# Patient Record
Sex: Male | Born: 1985 | Race: White | Hispanic: No | Smoking: Never smoker
Health system: Southern US, Community
[De-identification: ages and names within clinical notes are randomized; demographics above are authoritative.]

## PROBLEM LIST (undated history)

## (undated) DIAGNOSIS — F419 Anxiety disorder, unspecified: Secondary | ICD-10-CM

## (undated) HISTORY — DX: Anxiety disorder, unspecified: F41.9

## (undated) HISTORY — PX: TONSILECTOMY, ADENOIDECTOMY, BILATERAL MYRINGOTOMY AND TUBES: SHX2538

---
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
## 2014-04-22 ENCOUNTER — Ambulatory Visit (INDEPENDENT_AMBULATORY_CARE_PROVIDER_SITE_OTHER): Payer: BLUE CROSS/BLUE SHIELD | Admitting: Family Medicine

## 2014-04-22 VITALS — BP 122/74 | HR 71 | Temp 98.0°F | Resp 17 | Ht 72.5 in | Wt 178.0 lb

## 2014-04-22 DIAGNOSIS — M549 Dorsalgia, unspecified: Secondary | ICD-10-CM

## 2014-04-22 DIAGNOSIS — M791 Myalgia, unspecified site: Secondary | ICD-10-CM

## 2014-04-22 LAB — POCT CBC
Granulocyte percent: 59.9 %G (ref 37–80)
HCT, POC: 41.9 % — AB (ref 43.5–53.7)
Hemoglobin: 13.8 g/dL — AB (ref 14.1–18.1)
Lymph, poc: 2.3 (ref 0.6–3.4)
MCH, POC: 30.3 pg (ref 27–31.2)
MCHC: 32.9 g/dL (ref 31.8–35.4)
MCV: 92 fL (ref 80–97)
MID (cbc): 0.3 (ref 0–0.9)
MPV: 7 fL (ref 0–99.8)
POC Granulocyte: 4 (ref 2–6.9)
POC LYMPH PERCENT: 35 %L (ref 10–50)
POC MID %: 5.1 %M (ref 0–12)
Platelet Count, POC: 244 10*3/uL (ref 142–424)
RBC: 4.55 M/uL — AB (ref 4.69–6.13)
RDW, POC: 12.1 %
WBC: 6.7 10*3/uL (ref 4.6–10.2)

## 2014-04-22 LAB — POCT UA - MICROSCOPIC ONLY
Bacteria, U Microscopic: NEGATIVE
Casts, Ur, LPF, POC: NEGATIVE
Crystals, Ur, HPF, POC: NEGATIVE
Epithelial cells, urine per micros: NEGATIVE
Mucus, UA: NEGATIVE
RBC, urine, microscopic: NEGATIVE
WBC, Ur, HPF, POC: NEGATIVE
Yeast, UA: NEGATIVE

## 2014-04-22 LAB — POCT URINALYSIS DIPSTICK
Bilirubin, UA: NEGATIVE
Blood, UA: NEGATIVE
Glucose, UA: NEGATIVE
Ketones, UA: NEGATIVE
Leukocytes, UA: NEGATIVE
Nitrite, UA: NEGATIVE
Protein, UA: NEGATIVE
Spec Grav, UA: 1.01
Urobilinogen, UA: 0.2
pH, UA: 6.5

## 2014-04-22 LAB — POCT SEDIMENTATION RATE: POCT SED RATE: 4 mm/hr (ref 0–22)

## 2014-04-22 MED ORDER — HYDROCODONE-ACETAMINOPHEN 5-325 MG PO TABS: 1.0000 | ORAL_TABLET | Freq: Four times a day (QID) | ORAL | Status: DC | PRN

## 2014-04-22 MED ORDER — CYCLOBENZAPRINE HCL 5 MG PO TABS: ORAL_TABLET | ORAL | Status: DC

## 2014-04-22 NOTE — Patient Instructions (Addendum)
Start over the counter advil or alleve, heat to area, flexeril and if needed - hydrocodone as needed for more severe pain.  If not improving into tomorrow - call me and I can order a CT scan of the spine to determine if other cause of pain. Return to the clinic or go to the nearest emergency room if any of your symptoms worsen or new symptoms occur.

## 2014-04-22 NOTE — Progress Notes (Addendum)
Subjective:  This chart was scribed for Merri Ray, MD, by Tamsen Roers, at [facility].  This patient was seen in room 8 and the patient's care was started at 4:28 PM.    Patient ID: Angel Baxter, male    DOB: 1986/01/29, 29 y.o.   MRN: [id number]  Back Pain Pertinent negatives include no fever.    HPI Comments: Angel Baxter is a 29 y.o. male who presents to the [facility] for radiating back pain that started 2 days ago. Patient states that the pain has gotten worse over the past 8 hours. He states the pain is worse with sitting, lying down, and standing. Notes the pain started in his low/mid back radiates up to his shoulder blades. He went to the ER last night and they did a urinalysis (which came out clear).  PTA (an hour ago), patient was at [facility] but states that they could not find anything on his X-Rays sothey sent him here for blood work.  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  Notes trimoidal did not help him and the Vicodin from the ER helped him sleep but didn't relieve the pain much. Denies fever nausea, vomiting, unexpected weight loss, night sweats, cough, SOB.  Patient took a 3/325 Vicodin this morning.  Patient notes that he did not feel his back pain when he went out in the cold this morning and thought it was going away but the pain came back once he went indoors.  Patient notes he is otherwise healthy.    Discussed with provider from [facility] who saw patient today.  No acute findings on back exam or lumbar X-Ray.      Review of Systems  Constitutional: Negative for fever.  Respiratory: Negative for cough and shortness of breath.   Gastrointestinal: Negative for nausea and vomiting.  Musculoskeletal: Positive for back pain.   There are no active problems to display for this patient.  No past medical history on file. No past  surgical history on file. No Known Allergies Prior to Admission medications   Not on File   History   Social History  . Marital Status: Significant Other    Spouse Name: N/A    Number of Children: N/A  . Years of Education: N/A   Occupational History  . Not on file.   Social History Main Topics  . Smoking status: Never Smoker   . Smokeless tobacco: Not on file  . Alcohol Use: Not on file  . Drug Use: Not on file  . Sexual Activity: Not on file   Other Topics Concern  . Not on file   Social History Narrative  . No narrative on file        Objective:   Physical Exam  Constitutional: He is oriented to person, place, and time. He appears well-developed and well-nourished. No distress.  HENT:  Head: Normocephalic and atraumatic.  Eyes: Conjunctivae and EOM are normal.  Neck: Neck supple. No tracheal deviation present.  Cardiovascular: Normal rate.   Pulmonary/Chest: Effort normal. No respiratory distress.  Abdominal: Soft. There is no tenderness.  Negative Mcburney's point Negative murphys sighn  Flat non distended nontender.    Musculoskeletal: Normal range of motion.  Lumbar spine full range of motion.  No appreciable spasm but notes location of pain deep to paraspinal muscles in mid to lower back.    Neurological:  He is alert and oriented to person, place, and time.  Skin: Skin is warm and dry.  Skin intact no rash and no superficial tenderness.   Psychiatric: He has a normal mood and affect. His behavior is normal.  Nursing note and vitals reviewed.    Filed Vitals:   04/22/14 1616  BP: 122/74  Pulse: 71  Temp: 98 F (36.7 C)  TempSrc: Oral  Resp: 17  Height: 6' 0.5" (1.842 m)  Weight: 178 lb (80.74 kg)  SpO2: 100%   Results for orders placed or performed in visit on 04/22/14  POCT urinalysis dipstick  Result Value Ref Range   Color, UA yellow    Clarity, UA clear    Glucose, UA neg    Bilirubin, UA neg    Ketones, UA neg    Spec Grav, UA  1.010    Blood, UA neg    pH, UA 6.5    Protein, UA neg    Urobilinogen, UA 0.2    Nitrite, UA neg    Leukocytes, UA Negative   POCT UA - Microscopic Only  Result Value Ref Range   WBC, Ur, HPF, POC neg    RBC, urine, microscopic neg    Bacteria, U Microscopic neg    Mucus, UA neg    Epithelial cells, urine per micros neg    Crystals, Ur, HPF, POC neg    Casts, Ur, LPF, POC neg    Yeast, UA neg   POCT CBC  Result Value Ref Range   WBC 6.7 4.6 - 10.2 K/uL   Lymph, poc 2.3 0.6 - 3.4   POC LYMPH PERCENT 35.0 10 - 50 %L   MID (cbc) 0.3 0 - 0.9   POC MID % 5.1 0 - 12 %M   POC Granulocyte 4.0 2 - 6.9   Granulocyte percent 59.9 37 - 80 %G   RBC 4.55 (A) 4.69 - 6.13 M/uL   Hemoglobin 13.8 (A) 14.1 - 18.1 g/dL   HCT, POC 41.9 (A) 43.5 - 53.7 %   MCV 92.0 80 - 97 fL   MCH, POC 30.3 27 - 31.2 pg   MCHC 32.9 31.8 - 35.4 g/dL   RDW, POC 12.1 %   Platelet Count, POC 244 142 - 424 K/uL   MPV 7.0 0 - 99.8 fL        Assessment & Plan:   Angel Baxter is a 29 y.o. male Mild back pain - Plan: POCT urinalysis dipstick, POCT UA - Microscopic Only, POCT CBC, POCT SEDIMENTATION RATE, CK, HYDROcodone-acetaminophen (NORCO/VICODIN) 5-325 MG per tablet, cyclobenzaprine (FLEXERIL) 5 MG tablet  Myalgia - Plan: CK, HYDROcodone-acetaminophen (NORCO/VICODIN) 5-325 MG per tablet, cyclobenzaprine (FLEXERIL) 5 MG tablet  Acute onset mid to lower back pain 2 days ago, with worsening. No chest or GI/GU symptoms.  Reassuring CBC, ESR, and discussed with ortho - LS spine at their office without concerning findings.   -trail of flexeril for possible strain Earney Hamburg and spasm, lortab if needed for increased pain.   -check CK, but less likely Rhabdo without change in activity.   -consider CT in am if not improving - overnight ER precautions discussed.   Meds ordered this encounter  Medications  . HYDROcodone-acetaminophen (NORCO/VICODIN) 5-325 MG per tablet    Sig: Take 1 tablet by mouth every 6 (six)  hours as needed for moderate pain.    Dispense:  15 tablet    Refill:  0  . cyclobenzaprine (FLEXERIL) 5 MG tablet  Sig: 1 pill by mouth up to every 8 hours as needed. Start with one pill by mouth each bedtime as needed due to sedation    Dispense:  15 tablet    Refill:  0   Patient Instructions  Start over the counter advil or alleve, heat to area, flexeril and if needed - hydrocodone as needed for more severe pain.  If not improving into tomorrow - call me and I can order a CT scan of the spine to determine if other cause of pain. Return to the clinic or go to the nearest emergency room if any of your symptoms worsen or new symptoms occur.      I personally performed the services described in this documentation, which was scribed in my presence. The recorded information has been reviewed and considered, and addended by me as needed.

## 2014-04-23 ENCOUNTER — Telehealth: Payer: Self-pay

## 2014-04-23 LAB — CK: Total CK: 199 U/L (ref 7–232)

## 2014-04-23 NOTE — Telephone Encounter (Signed)
Called pt to check status. Pain was worse overnight, but feeling a little better today.  Did need hydrocodone and flexeril last night, but no meds needed since during the night.  No hematuria, some increased frequency, but drinking more fluids.  Reviewed normal ESR, but CK level pending.  If worsening overnight - still consider CT.  Can call with status tomorrow if needed, and RTC precautions.

## 2014-04-23 NOTE — Telephone Encounter (Signed)
Pt is returning Dr. Neva SeatGreene call relating to last night visit   Best number [phone]

## 2014-04-24 ENCOUNTER — Emergency Department (HOSPITAL_COMMUNITY): Payer: BLUE CROSS/BLUE SHIELD

## 2014-04-24 ENCOUNTER — Encounter (HOSPITAL_COMMUNITY): Payer: Self-pay | Admitting: *Deleted

## 2014-04-24 ENCOUNTER — Ambulatory Visit (INDEPENDENT_AMBULATORY_CARE_PROVIDER_SITE_OTHER): Payer: BLUE CROSS/BLUE SHIELD

## 2014-04-24 ENCOUNTER — Ambulatory Visit (INDEPENDENT_AMBULATORY_CARE_PROVIDER_SITE_OTHER): Payer: BLUE CROSS/BLUE SHIELD | Admitting: Family Medicine

## 2014-04-24 ENCOUNTER — Emergency Department (HOSPITAL_COMMUNITY)
Admission: EM | Admit: 2014-04-24 | Discharge: 2014-04-24 | Disposition: A | Discharge status: Home / Self Care | Payer: BLUE CROSS/BLUE SHIELD | Attending: Emergency Medicine | Admitting: Emergency Medicine

## 2014-04-24 VITALS — BP 148/78 | HR 62 | Temp 97.6°F | Resp 16 | Ht 71.0 in | Wt 181.6 lb

## 2014-04-24 DIAGNOSIS — M546 Pain in thoracic spine: Secondary | ICD-10-CM | POA: Insufficient documentation

## 2014-04-24 DIAGNOSIS — R1012 Left upper quadrant pain: Secondary | ICD-10-CM | POA: Diagnosis not present

## 2014-04-24 DIAGNOSIS — R1084 Generalized abdominal pain: Secondary | ICD-10-CM

## 2014-04-24 DIAGNOSIS — R1032 Left lower quadrant pain: Secondary | ICD-10-CM | POA: Diagnosis present

## 2014-04-24 DIAGNOSIS — M549 Dorsalgia, unspecified: Secondary | ICD-10-CM

## 2014-04-24 DIAGNOSIS — R112 Nausea with vomiting, unspecified: Secondary | ICD-10-CM | POA: Diagnosis not present

## 2014-04-24 DIAGNOSIS — M545 Low back pain: Secondary | ICD-10-CM | POA: Insufficient documentation

## 2014-04-24 LAB — CBC WITH DIFFERENTIAL/PLATELET
BASOS ABS: 0 10*3/uL (ref 0.0–0.1)
BASOS PCT: 0 % (ref 0–1)
EOS ABS: 0.1 10*3/uL (ref 0.0–0.7)
EOS PCT: 1 % (ref 0–5)
HCT: 38.4 % — ABNORMAL LOW (ref 39.0–52.0)
Hemoglobin: 13.6 g/dL (ref 13.0–17.0)
LYMPHS ABS: 1.3 10*3/uL (ref 0.7–4.0)
Lymphocytes Relative: 23 % (ref 12–46)
MCH: 30.3 pg (ref 26.0–34.0)
MCHC: 35.4 g/dL (ref 30.0–36.0)
MCV: 85.5 fL (ref 78.0–100.0)
MONO ABS: 0.8 10*3/uL (ref 0.1–1.0)
Monocytes Relative: 13 % — ABNORMAL HIGH (ref 3–12)
Neutro Abs: 3.7 10*3/uL (ref 1.7–7.7)
Neutrophils Relative %: 63 % (ref 43–77)
PLATELETS: 217 10*3/uL (ref 150–400)
RBC: 4.49 MIL/uL (ref 4.22–5.81)
RDW: 11.5 % (ref 11.5–15.5)
WBC: 5.9 10*3/uL (ref 4.0–10.5)

## 2014-04-24 LAB — LIPASE, BLOOD: LIPASE: 32 U/L (ref 11–59)

## 2014-04-24 LAB — COMPREHENSIVE METABOLIC PANEL
ALT: 15 U/L (ref 0–53)
AST: 20 U/L (ref 0–37)
Albumin: 4.6 g/dL (ref 3.5–5.2)
Alkaline Phosphatase: 41 U/L (ref 39–117)
Anion gap: 9 (ref 5–15)
BUN: 8 mg/dL (ref 6–23)
CALCIUM: 9.4 mg/dL (ref 8.4–10.5)
CO2: 25 mmol/L (ref 19–32)
Chloride: 98 mEq/L (ref 96–112)
Creatinine, Ser: 0.95 mg/dL (ref 0.50–1.35)
GFR calc non Af Amer: >90 mL/min (ref >90)
Glucose, Bld: 107 mg/dL — ABNORMAL HIGH (ref 70–99)
Potassium: 3.5 mmol/L (ref 3.5–5.1)
Sodium: 132 mmol/L — ABNORMAL LOW (ref 135–145)
Total Bilirubin: 0.9 mg/dL (ref 0.3–1.2)
Total Protein: 7.3 g/dL (ref 6.0–8.3)

## 2014-04-24 LAB — POCT UA - MICROSCOPIC ONLY
Bacteria, U Microscopic: NEGATIVE
Casts, Ur, LPF, POC: NEGATIVE
Crystals, Ur, HPF, POC: NEGATIVE
Epithelial cells, urine per micros: NEGATIVE
Mucus, UA: NEGATIVE
RBC, URINE, MICROSCOPIC: NEGATIVE
WBC, UR, HPF, POC: NEGATIVE
Yeast, UA: NEGATIVE

## 2014-04-24 LAB — URINALYSIS, ROUTINE W REFLEX MICROSCOPIC
BILIRUBIN URINE: NEGATIVE
Glucose, UA: NEGATIVE mg/dL
HGB URINE DIPSTICK: NEGATIVE
Ketones, ur: 40 mg/dL — AB
Leukocytes, UA: NEGATIVE
Nitrite: NEGATIVE
Protein, ur: NEGATIVE mg/dL
Specific Gravity, Urine: 1.013 (ref 1.005–1.030)
UROBILINOGEN UA: 1 mg/dL (ref 0.0–1.0)
pH: 7.5 (ref 5.0–8.0)

## 2014-04-24 LAB — POCT CBC
Granulocyte percent: 65.8 %G (ref 37–80)
HCT, POC: 42.3 % — AB (ref 43.5–53.7)
Hemoglobin: 13.9 g/dL — AB (ref 14.1–18.1)
Lymph, poc: 1.7 (ref 0.6–3.4)
MCH, POC: 30.8 pg (ref 27–31.2)
MCHC: 32.8 g/dL (ref 31.8–35.4)
MCV: 93.9 fL (ref 80–97)
MID (cbc): 0.5 (ref 0–0.9)
MPV: 6.9 fL (ref 0–99.8)
POC Granulocyte: 4.2 (ref 2–6.9)
POC LYMPH PERCENT: 26.3 %L (ref 10–50)
POC MID %: 7.9 %M (ref 0–12)
Platelet Count, POC: 247 10*3/uL (ref 142–424)
RBC: 4.5 M/uL — AB (ref 4.69–6.13)
RDW, POC: 12.4 %
WBC: 6.4 10*3/uL (ref 4.6–10.2)

## 2014-04-24 LAB — POCT URINALYSIS DIPSTICK
BILIRUBIN UA: NEGATIVE
Blood, UA: NEGATIVE
Glucose, UA: NEGATIVE
LEUKOCYTES UA: NEGATIVE
Nitrite, UA: NEGATIVE
PH UA: 7.5
Protein, UA: NEGATIVE
Spec Grav, UA: 1.01
UROBILINOGEN UA: 0.2

## 2014-04-24 LAB — GLUCOSE, POCT (MANUAL RESULT ENTRY): POC Glucose: 106 mg/dl — AB (ref 70–99)

## 2014-04-24 MED ORDER — ONDANSETRON HCL 4 MG PO TABS: 4.0000 mg | ORAL_TABLET | Freq: Four times a day (QID) | ORAL | Status: DC

## 2014-04-24 MED ORDER — MORPHINE SULFATE 4 MG/ML IJ SOLN
4.0000 mg | Freq: Once | INTRAMUSCULAR | Status: AC
  Administered 2014-04-24: 4 mg via INTRAVENOUS
  Filled 2014-04-24: qty 1

## 2014-04-24 MED ORDER — IOHEXOL 300 MG/ML  SOLN
25.0000 mL | Freq: Once | INTRAMUSCULAR | Status: AC | PRN
  Administered 2014-04-24: 25 mL via ORAL

## 2014-04-24 MED ORDER — IOHEXOL 300 MG/ML  SOLN
100.0000 mL | Freq: Once | INTRAMUSCULAR | Status: AC | PRN
  Administered 2014-04-24: 100 mL via INTRAVENOUS

## 2014-04-24 MED ORDER — ONDANSETRON 4 MG PO TBDP
4.0000 mg | ORAL_TABLET | Freq: Once | ORAL | Status: AC
  Administered 2014-04-24: 4 mg via ORAL

## 2014-04-24 MED ORDER — OXYCODONE-ACETAMINOPHEN 5-325 MG PO TABS: 2.0000 | ORAL_TABLET | ORAL | Status: DC | PRN

## 2014-04-24 NOTE — Discharge Instructions (Signed)
Abdominal Pain Many things can cause abdominal pain. Usually, abdominal pain is not caused by a disease and will improve without treatment. It can often be observed and treated at home. Your health care provider will do a physical exam and possibly order blood tests and X-rays to help determine the seriousness of your pain. However, in many cases, more time must pass before a clear cause of the pain can be found. Before that point, your health care provider may not know if you need more testing or further treatment. HOME CARE INSTRUCTIONS  Monitor your abdominal pain for any changes. The following actions may help to alleviate any discomfort you are experiencing:  Only take over-the-counter or prescription medicines as directed by your health care provider.  Do not take laxatives unless directed to do so by your health care provider.  Try a clear liquid diet (broth, tea, or water) as directed by your health care provider. Slowly move to a bland diet as tolerated. SEEK MEDICAL CARE IF:  You have unexplained abdominal pain.  You have abdominal pain associated with nausea or diarrhea.  You have pain when you urinate or have a bowel movement.  You experience abdominal pain that wakes you in the night.  You have abdominal pain that is worsened or improved by eating food.  You have abdominal pain that is worsened with eating fatty foods.  You have a fever. SEEK IMMEDIATE MEDICAL CARE IF:   Your pain does not go away within 2 hours.  You keep throwing up (vomiting).  Your pain is felt only in portions of the abdomen, such as the right side or the left lower portion of the abdomen.  You pass bloody or black tarry stools. MAKE SURE YOU:  Understand these instructions.   Will watch your condition.   Will get help right away if you are not doing well or get worse.  Document Released: 01/02/2005 Document Revised: 03/30/2013 Document Reviewed: 12/02/2012 [facility] Patient Information  2015 [employer] [place]. This information is not intended to replace advice given to you by your health care provider. Make sure you discuss any questions you have with your health care provider.   You were evaluated in the ED today for your back and abdominal pain. There does not appear to be an emergent source for your symptoms at this time. Your CT scan did not reveal any acute causes for your pain. It is important for you to follow-up with orthopedics for further evaluation and management of your symptoms. Please take your pain medicines as prescribed. He may use the nausea medicine as needed. Return to ED for worsening symptoms, fevers, numbness or weakness, loss of bowel or bladder function

## 2014-04-24 NOTE — Progress Notes (Addendum)
Subjective:    Patient ID: [name], male    DOB: 01/30/1986, 29 y.o.   MRN: [id number] This chart was scribed for Meredith Staggers, MD by Jolene Provost, Medical Scribe. This patient was seen in Room 1 and the patient's care was started a 10:35 AM.  Chief Complaint  Patient presents with  . Follow-up  . Back Pain    HPI HPI Comments: STOY FENN is a 29 y.o. male who presents to [facility] for a follow up complaining of continuing back pain, worse with sitting or laying down, better with standing, and states that his back pain has now moved from the small of his back up into his shoulder blades and neck. Pt denies fever, chills, photophobia, phonophobia or HA.  Pt also states he has developed abdominal pain on lower left side with associated vomiting that started yesterday evening. Pt states he vomited twice last night. Pt has not vomited this morning. Pt states he thinks the Vicodin may have caused his vomiting and abdominal pain. Pt endorses increased urination, but states he has been drinking increased fluids. Pt denies dysuria, hematuria, rash on abdomen, SOB, CP or swelling in calfs. Pt denies hx of blood clots. Pt denies hx of similar sx. Pt states he did not take any hydrocodone yesterday during the day.  Pt was last seen two days ago for back pain after an evaluation in the ED, as well as orthopedics for his sx. Pt reported normal x-rays in orthopedics. He was afebrile with benign abdominal exam. Pt had no cardiac or pulmonary sx. Vitals normal. SED rate normal. CK level normal. Repeat urinalysis at [facility] was also negative. Pt had no known injury or change in activities. Was treated with flexeril and hydrocodone.   There are no active problems to display for this patient.  No past medical history on file. No past surgical history on file. No Known Allergies Prior to Admission medications   Medication Sig Start Date End Date Taking? Authorizing Provider  cyclobenzaprine  (FLEXERIL) 5 MG tablet 1 pill by mouth up to every 8 hours as needed. Start with one pill by mouth each bedtime as needed due to sedation 04/22/14  Yes Shade Flood, MD  HYDROcodone-acetaminophen (NORCO/VICODIN) 5-325 MG per tablet Take 1 tablet by mouth every 6 (six) hours as needed for moderate pain. 04/22/14  Yes Shade Flood, MD   History   Social History  . Marital Status: Significant Other    Spouse Name: N/A    Number of Children: N/A  . Years of Education: N/A   Occupational History  . Not on file.   Social History Main Topics  . Smoking status: Never Smoker   . Smokeless tobacco: Not on file  . Alcohol Use: Not on file  . Drug Use: Not on file  . Sexual Activity: Not on file   Other Topics Concern  . Not on file   Social History Narrative    Review of Systems  Constitutional: Negative for fever and chills.  HENT:       Denies phonophobia  Eyes: Negative for photophobia.  Cardiovascular: Negative for chest pain and leg swelling.  Gastrointestinal: Positive for vomiting and abdominal pain.  Genitourinary: Negative for dysuria and hematuria.  Musculoskeletal: Positive for back pain.  Neurological: Negative for weakness and headaches.       Objective:   Physical Exam  Constitutional: He is oriented to person, place, and time. He appears well-developed and well-nourished.  HENT:  Head: Normocephalic and atraumatic.  Nose: Nose normal.  Mouth/Throat: Oropharynx is clear and moist.  No rash in mouth.  Eyes: Pupils are equal, round, and reactive to light.  Neck: Neck supple.  Negative brudzinski, with pain only in lower back, no neck pain.  Cardiovascular: Normal rate and regular rhythm.   Pulmonary/Chest: Effort normal and breath sounds normal. No respiratory distress.  Abdominal:  Abdomen flat. Normal bowel sounds. Mildly TTP LUQ, no rebound or gaurding. Minimal TTP LLQ. Negative murphy's sign. Negative mcburnies point. Negative heel jar.    Musculoskeletal:  No focal tenderness in the lumbar or thoracic spine. No appreciable spasm. Lumbar spine full ROM, but guarded with ROM.   Lymphadenopathy:    He has no cervical adenopathy.  Neurological: He is alert and oriented to person, place, and time.  Skin: Skin is warm and dry. No rash noted.  No rash.   Psychiatric: He has a normal mood and affect. His behavior is normal.  Nursing note and vitals reviewed.   Filed Vitals:   04/24/14 0946  BP: 148/78  Pulse: 62  Temp: 97.6 F (36.4 C)  TempSrc: Oral  Resp: 16  Height:  (1.803 m)  Weight: 181 lb 9.6 oz (82.373 kg)  SpO2: 100%   Results for orders placed or performed in visit on 04/24/14  POCT CBC  Result Value Ref Range   WBC 6.4 4.6 - 10.2 K/uL   Lymph, poc 1.7 0.6 - 3.4   POC LYMPH PERCENT 26.3 10 - 50 %L   MID (cbc) 0.5 0 - 0.9   POC MID % 7.9 0 - 12 %M   POC Granulocyte 4.2 2 - 6.9   Granulocyte percent 65.8 37 - 80 %G   RBC 4.50 (A) 4.69 - 6.13 M/uL   Hemoglobin 13.9 (A) 14.1 - 18.1 g/dL   HCT, POC 16.1 (A) 09.6 - 53.7 %   MCV 93.9 80 - 97 fL   MCH, POC 30.8 27 - 31.2 pg   MCHC 32.8 31.8 - 35.4 g/dL   RDW, POC 04.5 %   Platelet Count, POC 247 142 - 424 K/uL   MPV 6.9 0 - 99.8 fL  POCT glucose (manual entry)  Result Value Ref Range   POC Glucose 106 (A) 70 - 99 mg/dl  POCT urinalysis dipstick  Result Value Ref Range   Color, UA light yellow    Clarity, UA clear    Glucose, UA neg    Bilirubin, UA neg    Ketones, UA trace    Spec Grav, UA 1.010    Blood, UA neg    pH, UA 7.5    Protein, UA neg    Urobilinogen, UA 0.2    Nitrite, UA neg    Leukocytes, UA Negative   POCT UA - Microscopic Only  Result Value Ref Range   WBC, Ur, HPF, POC neg    RBC, urine, microscopic neg    Bacteria, U Microscopic neg    Mucus, UA neg    Epithelial cells, urine per micros neg    Crystals, Ur, HPF, POC neg    Casts, Ur, LPF, POC neg    Yeast, UA neg   [facility] reading (PRIMARY) by  Dr. Neva Seat: CXR: no  acute findings.     Assessment & Plan:   THAD OSORIA is a 29 y.o. male LUQ abdominal pain - Plan: DG Chest 2 View, POCT urinalysis dipstick, POCT UA - Microscopic Only  Upper back pain - Plan:  POCT CBC, DG Chest 2 View  Nausea and vomiting, vomiting of unspecified type - Plan: POCT CBC, POCT glucose (manual entry), POCT urinalysis dipstick, POCT UA - Microscopic Only, ondansetron (ZOFRAN-ODT) disintegrating tablet 4 mg   Acute onset of low to mid back pain 3 days ago without known injury. Progressively more painful since that time except some improvement during day yesterday. Now with L upper abdominal pain, N/V overnight. initial ER eval 3 days ago with reported negative U/A, ortho eval 2 days ago with reported negative XRays. Repeat U/a still negative, CBC reassuring and afebrile.  Sed rate 2 days ago normal less likely infectious, and CK level normal to r/o rhabdo. With worsening pain, now with abd pain, and N/V, will have evaluated in ER for possible CT abd/pelvis or other testing TBD by their eval.  [facility] advised.   Meds ordered this encounter  Medications  . ondansetron (ZOFRAN-ODT) disintegrating tablet 4 mg    Sig:    Patient Instructions  [facility] as well as the triage nurse. Go there for further eval after leaving here.  Nothing to eat for now in case anything is found on cat scan that could require surgery.  If needed- follow up with me after emergency room in next few days.

## 2014-04-24 NOTE — ED Provider Notes (Signed)
CSN: [id number]     Arrival date & time 04/24/14  1206 History   First MD Initiated Contact with Patient 04/24/14 1243     Chief Complaint  Patient presents with  . Back Pain  . Abdominal Pain     (Consider location/radiation/quality/duration/timing/severity/associated sxs/prior Treatment) HPI Angel Baxter is a 29 y.o. male , essentially healthy who comes in for evaluation of back pain and abdominal pain. Patient states 4 days ago he was sitting at home when he all of a sudden experienced a sharp stabbing pain in his lower back that has since spread to his entire spine. Sitting and lying flat exacerbates this pain, while standing and improves the pain. He has taken Vicodin with some relief. He rates the pain as 8/10. He also reports an associated abdominal pain and is right and left lower quadrants. He characterizes this as a dull ache and 6/10. He reports one episode of emesis this morning, is unsure if stomach contents because he was outside in the dark. Last bowel movement 3 days ago. Reports he is normally regular every day. Denies fevers headache, changes in vision, chest pain, shortness of breath, loss of bowel or bladder function, numbness or weakness, chronic steroid use, personal history of cancer, IV drug use. Seen in urgent care facility twice, referred here for CT scan of abdomen.  History reviewed. No pertinent past medical history. History reviewed. No pertinent past surgical history. History reviewed. No pertinent family history. History  Substance Use Topics  . Smoking status: Never Smoker   . Smokeless tobacco: Not on file  . Alcohol Use: Not on file    Review of Systems  All other systems reviewed and are negative. A 10 point review of systems was completed and was negative except for pertinent positives and negatives as mentioned in the history of present illness      Allergies  Review of patient's allergies indicates no known allergies.  Home Medications    Prior to Admission medications   Medication Sig Start Date End Date Taking? Authorizing Provider  HYDROcodone-acetaminophen (NORCO/VICODIN) 5-325 MG per tablet Take 1 tablet by mouth every 6 (six) hours as needed for moderate pain. 04/22/14  Yes Shade Flood, MD  cyclobenzaprine (FLEXERIL) 5 MG tablet 1 pill by mouth up to every 8 hours as needed. Start with one pill by mouth each bedtime as needed due to sedation Patient not taking: Reported on 04/24/2014 04/22/14   Shade Flood, MD  ondansetron (ZOFRAN) 4 MG tablet Take 1 tablet (4 mg total) by mouth every 6 (six) hours. 04/24/14   Sharlene Motts, PA-C  oxyCODONE-acetaminophen (PERCOCET/ROXICET) 5-325 MG per tablet Take 2 tablets by mouth every 4 (four) hours as needed for moderate pain or severe pain. 04/24/14   Earle Gell Yarithza Mink, PA-C   BP 137/87 mmHg  Pulse 65  Temp(Src) 98.8 F (37.1 C) (Oral)  Resp 18  Ht 6' (1.829 m)  Wt 185 lb (83.915 kg)  BMI 25.08 kg/m2  SpO2 100% Physical Exam  Constitutional: He is oriented to person, place, and time. He appears well-developed and well-nourished.  HENT:  Head: Normocephalic and atraumatic.  Mouth/Throat: Oropharynx is clear and moist.  Eyes: Conjunctivae are normal. Pupils are equal, round, and reactive to light. Right eye exhibits no discharge. Left eye exhibits no discharge. No scleral icterus.  Neck: Normal range of motion. Neck supple.  Cardiovascular: Normal rate, regular rhythm and normal heart sounds.   Pulmonary/Chest: Effort normal and breath sounds normal.  No respiratory distress. He has no wheezes. He has no rales.  Abdominal: Soft.  Diffuse tenderness without guarding to right and left lower quadrants. Abdomen is otherwise soft, nondistended without obvious lesions or deformities.  Musculoskeletal: He exhibits no tenderness.  diffuse tenderness throughout the paraspinal muscles in thoracic and lumbar region. No midline bony tenderness. No crepitus or step-offs. No  other obvious lesions or deformities. Range of motion intact with discomfort to thoracic and lumbar region  Neurological: He is alert and oriented to person, place, and time.  Cranial Nerves II-XII grossly intact. Motor and sensation 5/5 in all 4 extremities. Extraocular movements intact. Completes finger to nose hand movements without difficulty. Gait baseline  Skin: Skin is warm and dry. No rash noted.  Psychiatric: He has a normal mood and affect.  Nursing note and vitals reviewed.   ED Course  Procedures (including critical care time) Labs Review Labs Reviewed  URINALYSIS, ROUTINE W REFLEX MICROSCOPIC - Abnormal; Notable for the following:    Ketones, ur 40 (*)    All other components within normal limits  CBC WITH DIFFERENTIAL - Abnormal; Notable for the following:    HCT 38.4 (*)    Monocytes Relative 13 (*)    All other components within normal limits  COMPREHENSIVE METABOLIC PANEL - Abnormal; Notable for the following:    Sodium 132 (*)    Glucose, Bld 107 (*)    All other components within normal limits  LIPASE, BLOOD    Imaging Review Dg Chest 2 View  04/24/2014   CLINICAL DATA:  Initial encounter for shortness of breath and left upper quadrant abdominal pain.  EXAM: CHEST  2 VIEW  COMPARISON:  None.  FINDINGS: Patient rotated minimally left. Midline trachea. Normal heart size and mediastinal contours. No pleural effusion or pneumothorax. Clear lungs. No free intraperitoneal air.  IMPRESSION: No acute cardiopulmonary disease.   Electronically Signed   By: Jeronimo GreavesKyle  Talbot M.D.   On: 04/24/2014 12:20   Ct Abdomen Pelvis W Contrast  04/24/2014   CLINICAL DATA:  Bilateral flank pain, left upper quadrant pain, bilateral lower quadrant pain. Nausea, vomiting. Symptoms for 4 days.  EXAM: CT ABDOMEN AND PELVIS WITH CONTRAST  TECHNIQUE: Multidetector CT imaging of the abdomen and pelvis was performed using the standard protocol following bolus administration of intravenous contrast.   CONTRAST:  100mL OMNIPAQUE IOHEXOL 300 MG/ML  SOLN  COMPARISON:  None.  FINDINGS: Lung bases are clear.  No effusions.  Heart is normal size.  Liver, gallbladder, spleen, pancreas, adrenals and kidneys are normal. Appendix is visualized and is normal. Large stool burden throughout the colon. Stomach and small bowel are decompressed.  No renal or ureteral stones. No hydronephrosis. Urinary bladder is unremarkable as is the prostate.  No free fluid, free air or adenopathy.  Aorta is normal caliber.  No acute bony abnormality or focal bone lesion.  IMPRESSION: Large stool burden throughout the colon.  Normal appendix.  No acute findings.   Electronically Signed   By: Charlett NoseKevin  Dover M.D.   On: 04/24/2014 15:59     EKG Interpretation None     Meds given in ED:  Medications  morphine 4 MG/ML injection 4 mg (4 mg Intravenous Given 04/24/14 1352)  iohexol (OMNIPAQUE) 300 MG/ML solution 25 mL (25 mLs Oral Contrast Given 04/24/14 1511)  iohexol (OMNIPAQUE) 300 MG/ML solution 100 mL (100 mLs Intravenous Contrast Given 04/24/14 1543)    Discharge Medication List as of 04/24/2014  4:12 PM    START  taking these medications   Details  ondansetron (ZOFRAN) 4 MG tablet Take 1 tablet (4 mg total) by mouth every 6 (six) hours., Starting 04/24/2014, Until Discontinued, Print    oxyCODONE-acetaminophen (PERCOCET/ROXICET) 5-325 MG per tablet Take 2 tablets by mouth every 4 (four) hours as needed for moderate pain or severe pain., Starting 04/24/2014, Until Discontinued, Print       Filed Vitals:   04/24/14 1315 04/24/14 1415 04/24/14 1558 04/24/14 1616  BP: 153/89 130/76 145/85 137/87  Pulse: 53 48 56 65  Temp:      TempSrc:      Resp:   14 18  Height:      Weight:      SpO2: 100% 99% 100% 100%    MDM  Vitals stable - WNL -afebrile Pt resting comfortably in ED. Pain improved after analgesia administered in ED. PE--diffuse tenderness throughout the paraspinal muscles in thoracic and lumbar region. Normal  neurological exam. Labwork--noncontributory, white count 5.9. Imaging--CT abdomen shows no acute intra-abdominal pathology. No evidence of stones, appendix is normal. There is a large stool burden.  Will discharge with pain medicines, Zofran and referral to orthopedics for further evaluation and management of back pain. No apparent emergent or acute causes for abdominal discomfort.  I discussed all relevant lab findings and imaging results with pt and they verbalized understanding. Discussed f/u with PCP within 48 hrs and return precautions, pt very amenable to plan. Pt stale, in good condition and ambulates out of ED without difficulty.  Prior to patient discharge, I discussed and reviewed this case with Dr. Madilyn Hook Final diagnoses:  Bilateral back pain, unspecified location  Generalized abdominal pain        Sharlene Motts, PA-C 04/24/14 1836  Tilden Fossa, MD 04/27/14 [phone]

## 2014-04-24 NOTE — ED Notes (Signed)
Pt was seen at [facility] recently for back pain and treated with pain meds and flexeril. Today went back to dr office due to lower abd pain and LUQ pain, n/v.

## 2014-04-24 NOTE — Patient Instructions (Signed)
I [facility], as well as the triage nurse. Go there for further eval after leaving here.  Nothing to eat for now in case anything is found on cat scan that could require surgery.  If needed- follow up with me after emergency room in next few days.

## 2016-03-28 ENCOUNTER — Telehealth: Payer: Self-pay | Admitting: Family Medicine

## 2016-03-28 NOTE — Telephone Encounter (Signed)
Pt calling for a copy of his immunizations please call when ready

## 2017-01-28 DIAGNOSIS — Z23 Encounter for immunization: Secondary | ICD-10-CM | POA: Diagnosis not present

## 2017-03-11 DIAGNOSIS — R69 Illness, unspecified: Secondary | ICD-10-CM | POA: Diagnosis not present

## 2017-03-24 DIAGNOSIS — R69 Illness, unspecified: Secondary | ICD-10-CM | POA: Diagnosis not present

## 2017-04-14 DIAGNOSIS — R69 Illness, unspecified: Secondary | ICD-10-CM | POA: Diagnosis not present

## 2017-06-19 ENCOUNTER — Ambulatory Visit: Payer: BLUE CROSS/BLUE SHIELD | Admitting: Family Medicine

## 2018-01-28 DIAGNOSIS — Z23 Encounter for immunization: Secondary | ICD-10-CM | POA: Diagnosis not present

## 2018-08-25 DIAGNOSIS — Z03818 Encounter for observation for suspected exposure to other biological agents ruled out: Secondary | ICD-10-CM | POA: Diagnosis not present

## 2018-10-30 DIAGNOSIS — R3915 Urgency of urination: Secondary | ICD-10-CM | POA: Diagnosis not present

## 2018-10-31 ENCOUNTER — Other Ambulatory Visit: Payer: Self-pay

## 2018-10-31 ENCOUNTER — Encounter (HOSPITAL_COMMUNITY): Payer: Self-pay

## 2018-10-31 ENCOUNTER — Ambulatory Visit (HOSPITAL_COMMUNITY)
Admission: EM | Admit: 2018-10-31 | Discharge: 2018-10-31 | Disposition: A | Discharge status: Home / Self Care | Payer: 59 | Attending: Emergency Medicine | Admitting: Emergency Medicine

## 2018-10-31 DIAGNOSIS — R350 Frequency of micturition: Secondary | ICD-10-CM | POA: Diagnosis not present

## 2018-10-31 LAB — POCT URINALYSIS DIP (DEVICE)
Bilirubin Urine: NEGATIVE
Glucose, UA: NEGATIVE mg/dL
Hgb urine dipstick: NEGATIVE
Ketones, ur: NEGATIVE mg/dL
Leukocytes,Ua: NEGATIVE
Nitrite: NEGATIVE
Protein, ur: NEGATIVE mg/dL
Specific Gravity, Urine: 1.01 (ref 1.005–1.030)
Urobilinogen, UA: 0.2 mg/dL (ref 0.0–1.0)
pH: 7 (ref 5.0–8.0)

## 2018-10-31 NOTE — ED Provider Notes (Signed)
[facility]    CSN: [id number] Arrival date & time: 10/31/18  1119      History   Chief Complaint Chief Complaint  Patient presents with  . Urinary Tract Infection    HPI Angel Baxter is a 33 y.o. male.   Angel Baxter presents with complaints of urinary symptoms which he felt like started approximately 5 days ago. Did a virtual visit and was given macrobid which he has taken for two days, symptoms have not improved. He feels he has some urinary frequency and urgency, sensation of still needing to urinate after he voids. Minimal pain with urination. Some back pain. No pelvic pain. No fevers. No sores, lesions, redness or swelling to genitals or scrotum. Denies  Any previous similar. Sexually active with his wife. His urine has been pale yellow. No gi complaints. He endorses significant caffeine intake due to work as well as with his pre-workout drinks. Without contributing medical history.      ROS per HPI, negative if not otherwise mentioned.      History reviewed. No pertinent past medical history.  There are no active problems to display for this patient.   History reviewed. No pertinent surgical history.     Home Medications    Prior to Admission medications   Medication Sig Start Date End Date Taking? Authorizing Provider  cyclobenzaprine (FLEXERIL) 5 MG tablet 1 pill by mouth up to every 8 hours as needed. Start with one pill by mouth each bedtime as needed due to sedation Patient not taking: Reported on 04/24/2014 04/22/14   Shade FloodGreene, Jeffrey R, MD  HYDROcodone-acetaminophen (NORCO/VICODIN) 5-325 MG per tablet Take 1 tablet by mouth every 6 (six) hours as needed for moderate pain. 04/22/14   Shade FloodGreene, Jeffrey R, MD  ondansetron (ZOFRAN) 4 MG tablet Take 1 tablet (4 mg total) by mouth every 6 (six) hours. 04/24/14   Cartner, Sharlet SalinaBenjamin, PA-C  oxyCODONE-acetaminophen (PERCOCET/ROXICET) 5-325 MG per tablet Take 2 tablets by mouth every 4 (four) hours as needed  for moderate pain or severe pain. 04/24/14   Joycie Peekartner, Benjamin, PA-C    Family History No family history on file.  Social History Social History   Tobacco Use  . Smoking status: Never Smoker  . Smokeless tobacco: Never Used  Substance Use Topics  . Alcohol use: Never    Alcohol/week: 0.0 standard drinks    Frequency: Never  . Drug use: Never     Allergies   Patient has no known allergies.   Review of Systems Review of Systems   Physical Exam Triage Vital Signs ED Triage Vitals  Enc Vitals Group     BP 10/31/18 1224 133/79     Pulse Rate 10/31/18 1224 64     Resp 10/31/18 1224 18     Temp 10/31/18 1224 98.6 F (37 C)     Temp Source 10/31/18 1224 Oral     SpO2 10/31/18 1224 97 %     Weight 10/31/18 1222 215 lb (97.5 kg)     Height --      Head Circumference --      Peak Flow --      Pain Score 10/31/18 1222 2     Pain Loc --      Pain Edu? --      Excl. in GC? --    No data found.  Updated Vital Signs BP 133/79 (BP Location: Right Arm)   Pulse 64   Temp 98.6 F (37 C) (Oral)  Resp 18   Wt 215 lb (97.5 kg)   SpO2 97%   BMI 29.16 kg/m    Physical Exam Constitutional:      Appearance: He is well-developed.  Cardiovascular:     Rate and Rhythm: Normal rate.  Pulmonary:     Effort: Pulmonary effort is normal.  Abdominal:     Tenderness: There is no abdominal tenderness.     Comments: Very mild left back pain on palpation; no abdominal or pelvic pain on palpation.   Skin:    General: Skin is warm and dry.  Neurological:     Mental Status: He is alert and oriented to person, place, and time.      UC Treatments / Results  Labs (all labs ordered are listed, but only abnormal results are displayed) Labs Reviewed  URINE CULTURE  POCT URINALYSIS DIP (DEVICE)  URINE CYTOLOGY ANCILLARY ONLY    EKG   Radiology No results found.  Procedures Procedures (including critical care time)  Medications Ordered in UC Medications - No data to  display  Initial Impression / Assessment and Plan / UC Course  I have reviewed the triage vital signs and the nursing notes.  Pertinent labs & imaging results that were available during my care of the patient were reviewed by me and considered in my medical decision making (see chart for details).     Healthy 33 yo male with urinary symptoms. macrobid not necessarily helpful with symptoms. Afebrile. Urine today is completely normal. Will obtain culture to confirm this. Urine cytology collected as well to ensure negative. Question if caffeine intake is contributing to symptoms? Will notify of any positive findings and if any changes to treatment are needed.  If symptoms worsen or do not improve in the next week to return to be seen or to follow up with PCP.  Patient verbalized understanding and agreeable to plan.   Final Clinical Impressions(s) / UC Diagnoses   Final diagnoses:  Urinary frequency     Discharge Instructions     Your urine sample is completely normal here today which is reassuring.  I don't see any indication of UTI. I will add a culture to this sample to see if anything potentially grows to confirm our finding here in clinic.  It would be uncommon for an otherwise healthy male to have a UTI.  Caffeine can certainly cause some of the symptoms you are experiencing so try to limit this as able.  Drink plenty of water.  Will notify you of any positive findings and if any changes to treatment are needed.   If your symptoms persist or do not improve in the next week to follow up with your primary care provider.    ED Prescriptions    None     Controlled Substance Prescriptions [place] Controlled Substance Registry consulted? Not Applicable   Zigmund Gottron, NP 10/31/18 1300

## 2018-10-31 NOTE — ED Triage Notes (Signed)
Pt states he has a UTI x 1 week.  Pt states he has been taking Microbid and its not working.

## 2018-10-31 NOTE — Discharge Instructions (Addendum)
Your urine sample is completely normal here today which is reassuring.  I don't see any indication of UTI. I will add a culture to this sample to see if anything potentially grows to confirm our finding here in clinic.  It would be uncommon for an otherwise healthy male to have a UTI.  Caffeine can certainly cause some of the symptoms you are experiencing so try to limit this as able.  Drink plenty of water.  Will notify you of any positive findings and if any changes to treatment are needed.   If your symptoms persist or do not improve in the next week to follow up with your primary care provider.

## 2018-11-01 LAB — URINE CULTURE: Culture: NO GROWTH

## 2018-11-03 LAB — URINE CYTOLOGY ANCILLARY ONLY
Chlamydia: NEGATIVE
Neisseria Gonorrhea: NEGATIVE
Trichomonas: NEGATIVE

## 2018-11-10 DIAGNOSIS — Z139 Encounter for screening, unspecified: Secondary | ICD-10-CM | POA: Diagnosis not present

## 2018-12-01 DIAGNOSIS — L910 Hypertrophic scar: Secondary | ICD-10-CM | POA: Diagnosis not present

## 2018-12-01 DIAGNOSIS — D485 Neoplasm of uncertain behavior of skin: Secondary | ICD-10-CM | POA: Diagnosis not present

## 2018-12-28 DIAGNOSIS — Z03818 Encounter for observation for suspected exposure to other biological agents ruled out: Secondary | ICD-10-CM | POA: Diagnosis not present

## 2019-01-13 ENCOUNTER — Ambulatory Visit: Payer: 59 | Admitting: Family Medicine

## 2019-01-13 ENCOUNTER — Other Ambulatory Visit: Payer: Self-pay

## 2019-01-13 ENCOUNTER — Encounter: Payer: Self-pay | Admitting: Family Medicine

## 2019-01-13 VITALS — BP 120/78 | HR 58 | Temp 98.7°F | Wt 207.2 lb

## 2019-01-13 DIAGNOSIS — I493 Ventricular premature depolarization: Secondary | ICD-10-CM | POA: Diagnosis not present

## 2019-01-13 DIAGNOSIS — R002 Palpitations: Secondary | ICD-10-CM | POA: Diagnosis not present

## 2019-01-13 NOTE — Progress Notes (Signed)
Subjective:    Patient ID: Angel Baxter, male    DOB: [birth year], 33 y.o.   MRN: [id number]  HPI Angel Baxter is a 33 y.o. male Presents today for: Chief Complaint  Patient presents with  . Establish Care    New patient here to establish care today   No chronic med problems.  No rx meds.   Has noticed more pvc's recently.  Initially noticed when younger - more in college, then improved.  Last cardiology eval about 10-12 years ago - 24hr monitor - PVC's no concern. More past few weeks. Notes after workout usually.  No nocturnal symptoms.  24 hr shifts - 2-3 hrs at work (10 days per month). Not able to nap past 7 months as son at home - not in preschool.  No racing.  Notices 1-2 second pause, then hard beat.  No associated CP/dizziness/HA dyspnea.  Some increased stress past 6 months, no changes past few weeks with increased pvc's. Feels like managing stress well at this point.  Last felt few days ago.  Exercise: daily.   Supplements: preworkout, protein, recovery - cardarine - orders online. (this is a SARM)- only used past week. Plans on using 8 weeks only.  Caffeine:  in preworkout, coffee - 3-4 8oz cups. 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 Some improvement in PVC's with decreasing caffeine.  10 yo son. Alcohol: none No tobacco.  No IDU. No CBD.  Declines sti testing   There are no active problems to display for this patient.  Past Medical History:  Diagnosis Date  . Anxiety    No past surgical history on file. No Known Allergies Prior to Admission medications   Not on File   Social History   Socioeconomic History  . Marital status: Significant Other    Spouse name: Not on file  . Number of children: Not on file  . Years of education: Not on file  . Highest education level: Not on file  Occupational History  . Not on file  Social Needs  . Financial resource strain: Not on file  . Food insecurity    Worry: Not on file    Inability:  Not on file  . Transportation needs    Medical: Not on file    Non-medical: Not on file  Tobacco Use  . Smoking status: Never Smoker  . Smokeless tobacco: Never Used  Substance and Sexual Activity  . Alcohol use: Never    Alcohol/week: 0.0 standard drinks    Frequency: Never  . Drug use: Never  . Sexual activity: Not on file  Lifestyle  . Physical activity    Days per week: Not on file    Minutes per session: Not on file  . Stress: Not on file  Relationships  . Social Musician on phone: Not on file    Gets together: Not on file    Attends religious service: Not on file    Active member of club or organization: Not on file    Attends meetings of clubs or organizations: Not on file    Relationship status: Not on file  . Intimate partner violence    Fear of current or ex partner: Not on file    Emotionally abused: Not on file    Physically abused: Not on file    Forced sexual activity: Not on file  Other Topics Concern  . Not on file  Social History Narrative  . Not  on file    Review of Systems     Objective:   Physical Exam Vitals signs reviewed.  Constitutional:      Appearance: He is well-developed.  HENT:     Head: Normocephalic and atraumatic.  Eyes:     Pupils: Pupils are equal, round, and reactive to light.  Neck:     Vascular: No carotid bruit or JVD.     Comments: No thyromegaly or nodule appreciated.  Cardiovascular:     Rate and Rhythm: Normal rate and regular rhythm.     Heart sounds: Normal heart sounds. No murmur. No gallop.      Comments: No ectopy on exam.  Pulmonary:     Effort: Pulmonary effort is normal.     Breath sounds: Normal breath sounds. No rales.  Skin:    General: Skin is warm and dry.  Neurological:     Mental Status: He is alert and oriented to person, place, and time.    Vitals:   01/13/19 1327  BP: 120/78  Pulse: (!) 58  Temp: 98.7 F (37.1 C)  TempSrc: Oral  SpO2: 97%  Weight: 207 lb 3.2 oz (94 kg)     EKG: SR, no PVC, rate 56, no acute findings.       Assessment & Plan:   Angel Baxter is a 33 y.o. male Symptomatic PVCs - Plan: CBC, Comprehensive metabolic panel, TSH, EKG 12-Lead  Palpitations - Plan: CBC, Comprehensive metabolic panel, TSH, EKG 12-Lead  - in office EKG without concerns.  Some improvement with adjustment of caffeine.  Possibly multifactorial.  History of stress/anxiety but feels like he is managing that currently.  -Continue to monitor caffeine/decrease slowly.  Caution with use of supplements.   -Maintain hydration.  Sleep also discussed but difficult with current job and home responsibilities.  -Consider cardiology eval if persistent or worsening.  -Recheck 1 month for physical,  follow-up PVCs. No orders of the defined types were placed in this encounter.  Patient Instructions    See information below on PVCs.  I will check some screening blood test as we discussed.  Initially would recommend slowly decreasing caffeine intake, make sure to stay well-hydrated.  Would be cautious with supplements as some of those may be contributing as well.  Increase sleep also recommended as possible, but I know that is difficult with current work situation.  Recheck in 1 month.  Sooner if worsening PVCs or new/worsening symptoms.   Premature Ventricular Contraction  A premature ventricular contraction (PVC) is a common kind of irregular heartbeat (arrhythmia). These contractions are extra heartbeats that start in the ventricles of the heart and occur too early in the normal sequence. During the PVC, the heart's normal electrical pathway is not used, so the beat is shorter and less effective. In most cases, these contractions come and go and do not require treatment. What are the causes? Common causes of the condition include:  Smoking.  Drinking alcohol.  Certain medicines.  Some illegal drugs.  Stress.  Caffeine. Certain medical conditions can also cause PVCs:   Heart failure.  Heart attack, or coronary artery disease.  Heart valve problems.  Changes in minerals in the blood (electrolytes).  Low blood oxygen levels or high carbon dioxide levels. In many cases, the cause of this condition is not known. What are the signs or symptoms? The main symptom of this condition is fast or skipped heartbeats (palpitations). Other symptoms include:  Chest pain.  Shortness of breath.  Feeling tired.  Dizziness.  Difficulty exercising. In some cases, there are no symptoms. How is this diagnosed? This condition may be diagnosed based on:  Your medical history.  A physical exam. During the exam, the health care provider will check for irregular heartbeats.  Tests, such as: ? An ECG (electrocardiogram) to monitor the electrical activity of your heart. ? An ambulatory cardiac monitor. This device records your heartbeats for 24 hours or more. ? Stress tests to see how exercise affects your heart rhythm and blood supply. ? An echocardiogram. This test uses sound waves (ultrasound) to produce an image of your heart. ? An electrophysiology study (EPS). This test checks for electrical problems in your heart. How is this treated? Treatment for this condition depends on any underlying conditions, the type of PVCs that you are having, and how much the symptoms are interfering with your daily life. Possible treatments include:  Avoiding things that cause premature contractions (triggers). These include caffeine and alcohol.  Taking medicines if symptoms are severe or if the extra heartbeats are frequent.  Getting treatment for underlying conditions that cause PVCs.  Having an implantable cardioverter defibrillator (ICD), if you are at risk for a serious arrhythmia. The ICD is a small device that is inserted into your chest to monitor your heartbeat. When it senses an irregular heartbeat, it sends a shock to bring the heartbeat back to normal.  Having a  procedure to destroy the portion of the heart tissue that sends out abnormal signals (catheter ablation). In some cases, no treatment is required. Follow these instructions at home: Lifestyle  Do not use any products that contain nicotine or tobacco, such as cigarettes, e-cigarettes, and chewing tobacco. If you need help quitting, ask your health care provider.  Do not use illegal drugs.  Exercise regularly. Ask your health care provider what type of exercise is safe for you.  Try to get at least 7-9 hours of sleep each night, or as much as recommended by your health care provider.  Find healthy ways to manage stress. Avoid stressful situations when possible. Alcohol use  Do not drink alcohol if: ? Your health care provider tells you not to drink. ? You are pregnant, may be pregnant, or are planning to become pregnant. ? Alcohol triggers your episodes.  If you drink alcohol: ? Limit how much you use to:  0-1 drink a day for women.  0-2 drinks a day for men.  Be aware of how much alcohol is in your drink. In the [place] one drink equals one 12 oz bottle of beer (355 mL), one 5 oz glass of wine (148 mL), or one 1 oz glass of hard liquor (44 mL). General instructions  Take over-the-counter and prescription medicines only as told by your health care provider.  If caffeine triggers episodes of PVC, do not eat, drink, or use anything with caffeine in it.  Keep all follow-up visits as told by your health care provider. This is important. Contact a health care provider if you:  Feel palpitations. Get help right away if you:  Have chest pain.  Have shortness of breath.  Have sweating for no reason.  Have nausea and vomiting.  Become light-headed or you faint. Summary  A premature ventricular contraction (PVC) is a common kind of irregular heartbeat (arrhythmia).  In most cases, these contractions come and go and do not require treatment.  You may need to wear an  ambulatory cardiac monitor. This records your heartbeats for 24 hours or more.  Treatment depends on any underlying conditions, the type of PVCs that you are having, and how much the symptoms are interfering with your daily life. This information is not intended to replace advice given to you by your health care provider. Make sure you discuss any questions you have with your health care provider. Document Released: 11/10/2003 Document Revised: 12/18/2017 Document Reviewed: 12/18/2017 Elsevier Patient Education  2020 [employer].  Palpitations Palpitations are feelings that your heartbeat is irregular or is faster than normal. It may feel like your heart is fluttering or skipping a beat. Palpitations are usually not a serious problem. They may be caused by many things, including smoking, caffeine, alcohol, stress, and certain medicines or drugs. Most causes of palpitations are not serious. However, some palpitations can be a sign of a serious problem. You may need further tests to rule out serious medical problems. Follow these instructions at home:     Pay attention to any changes in your condition. Take these actions to help manage your symptoms: Eating and drinking  Avoid foods and drinks that may cause palpitations. These may include: ? Caffeinated coffee, tea, soft drinks, diet pills, and energy drinks. ? Chocolate. ? Alcohol. Lifestyle  Take steps to reduce your stress and anxiety. Things that can help you relax include: ? Yoga. ? Mind-body activities, such as deep breathing, meditation, or using words and images to create positive thoughts (guided imagery). ? Physical activity, such as swimming, jogging, or walking. Tell your health care provider if your palpitations increase with activity. If you have chest pain or shortness of breath with activity, do not continue the activity until you are seen by your health care provider. ? Biofeedback. This is a method that helps you learn to  use your mind to control things in your body, such as your heartbeat.  Do not use drugs, including cocaine or ecstasy. Do not use marijuana.  Get plenty of rest and sleep. Keep a regular bed time. General instructions  Take over-the-counter and prescription medicines only as told by your health care provider.  Do not use any products that contain nicotine or tobacco, such as cigarettes and e-cigarettes. If you need help quitting, ask your health care provider.  Keep all follow-up visits as told by your health care provider. This is important. These may include visits for further testing if palpitations do not go away or get worse. Contact a health care provider if you:  Continue to have a fast or irregular heartbeat after 24 hours.  Notice that your palpitations occur more often. Get help right away if you:  Have chest pain or shortness of breath.  Have a severe headache.  Feel dizzy or you faint. Summary  Palpitations are feelings that your heartbeat is irregular or is faster than normal. It may feel like your heart is fluttering or skipping a beat.  Palpitations may be caused by many things, including smoking, caffeine, alcohol, stress, certain medicines, and drugs.  Although most causes of palpitations are not serious, some causes can be a sign of a serious medical problem.  Get help right away if you faint or have chest pain, shortness of breath, a severe headache, or dizziness. This information is not intended to replace advice given to you by your health care provider. Make sure you discuss any questions you have with your health care provider. Document Released: 03/22/2000 Document Revised: 05/07/2017 Document Reviewed: 05/07/2017 Elsevier Patient Education  [employer].    If you have lab work  done today you will be contacted with your lab results within the next 2 weeks.  If you have not heard from [place] then please contact us. The fastest way to get your results  is to register for My Chart.   IF you received an x-ray today, you will receive an invoice from [facility]. Please contact [facility] at [phone] with questions or concerns regarding your invoice.   IF you received labwork today, you will receive an invoice from [place]. Please contact [facility] at [phone] with questions or concerns regarding your invoice.   Our billing staff will not be able to assist you with questions regarding bills from these companies.  You will be contacted with the lab results as soon as they are available. The fastest way to get your results is to activate your My Chart account. Instructions are located on the last page of this paperwork. If you have not heard from [place] regarding the results in 2 weeks, please contact this office.       Signed,   Merri Ray, MD [facility] [place].  01/13/19 6:42 PM

## 2019-01-13 NOTE — Patient Instructions (Addendum)
See information below on PVCs.  I will check some screening blood test as we discussed.  Initially would recommend slowly decreasing caffeine intake, make sure to stay well-hydrated.  Would be cautious with supplements as some of those may be contributing as well.  Increase sleep also recommended as possible, but I know that is difficult with current work situation.  Recheck in 1 month.  Sooner if worsening PVCs or new/worsening symptoms.   Premature Ventricular Contraction  A premature ventricular contraction (PVC) is a common kind of irregular heartbeat (arrhythmia). These contractions are extra heartbeats that start in the ventricles of the heart and occur too early in the normal sequence. During the PVC, the heart's normal electrical pathway is not used, so the beat is shorter and less effective. In most cases, these contractions come and go and do not require treatment. What are the causes? Common causes of the condition include:  Smoking.  Drinking alcohol.  Certain medicines.  Some illegal drugs.  Stress.  Caffeine. Certain medical conditions can also cause PVCs:  Heart failure.  Heart attack, or coronary artery disease.  Heart valve problems.  Changes in minerals in the blood (electrolytes).  Low blood oxygen levels or high carbon dioxide levels. In many cases, the cause of this condition is not known. What are the signs or symptoms? The main symptom of this condition is fast or skipped heartbeats (palpitations). Other symptoms include:  Chest pain.  Shortness of breath.  Feeling tired.  Dizziness.  Difficulty exercising. In some cases, there are no symptoms. How is this diagnosed? This condition may be diagnosed based on:  Your medical history.  A physical exam. During the exam, the health care provider will check for irregular heartbeats.  Tests, such as: ? An ECG (electrocardiogram) to monitor the electrical activity of your heart. ? An ambulatory  cardiac monitor. This device records your heartbeats for 24 hours or more. ? Stress tests to see how exercise affects your heart rhythm and blood supply. ? An echocardiogram. This test uses sound waves (ultrasound) to produce an image of your heart. ? An electrophysiology study (EPS). This test checks for electrical problems in your heart. How is this treated? Treatment for this condition depends on any underlying conditions, the type of PVCs that you are having, and how much the symptoms are interfering with your daily life. Possible treatments include:  Avoiding things that cause premature contractions (triggers). These include caffeine and alcohol.  Taking medicines if symptoms are severe or if the extra heartbeats are frequent.  Getting treatment for underlying conditions that cause PVCs.  Having an implantable cardioverter defibrillator (ICD), if you are at risk for a serious arrhythmia. The ICD is a small device that is inserted into your chest to monitor your heartbeat. When it senses an irregular heartbeat, it sends a shock to bring the heartbeat back to normal.  Having a procedure to destroy the portion of the heart tissue that sends out abnormal signals (catheter ablation). In some cases, no treatment is required. Follow these instructions at home: Lifestyle  Do not use any products that contain nicotine or tobacco, such as cigarettes, e-cigarettes, and chewing tobacco. If you need help quitting, ask your health care provider.  Do not use illegal drugs.  Exercise regularly. Ask your health care provider what type of exercise is safe for you.  Try to get at least 7-9 hours of sleep each night, or as much as recommended by your health care provider.  Find healthy ways  to manage stress. Avoid stressful situations when possible. Alcohol use  Do not drink alcohol if: ? Your health care provider tells you not to drink. ? You are pregnant, may be pregnant, or are planning to  become pregnant. ? Alcohol triggers your episodes.  If you drink alcohol: ? Limit how much you use to:  0-1 drink a day for women.  0-2 drinks a day for men.  Be aware of how much alcohol is in your drink. In the [place] one drink equals one 12 oz bottle of beer (355 mL), one 5 oz glass of wine (148 mL), or one 1 oz glass of hard liquor (44 mL). General instructions  Take over-the-counter and prescription medicines only as told by your health care provider.  If caffeine triggers episodes of PVC, do not eat, drink, or use anything with caffeine in it.  Keep all follow-up visits as told by your health care provider. This is important. Contact a health care provider if you:  Feel palpitations. Get help right away if you:  Have chest pain.  Have shortness of breath.  Have sweating for no reason.  Have nausea and vomiting.  Become light-headed or you faint. Summary  A premature ventricular contraction (PVC) is a common kind of irregular heartbeat (arrhythmia).  In most cases, these contractions come and go and do not require treatment.  You may need to wear an ambulatory cardiac monitor. This records your heartbeats for 24 hours or more.  Treatment depends on any underlying conditions, the type of PVCs that you are having, and how much the symptoms are interfering with your daily life. This information is not intended to replace advice given to you by your health care provider. Make sure you discuss any questions you have with your health care provider. Document Released: 11/10/2003 Document Revised: 12/18/2017 Document Reviewed: 12/18/2017 Elsevier Patient Education  2020 [employer].  Palpitations Palpitations are feelings that your heartbeat is irregular or is faster than normal. It may feel like your heart is fluttering or skipping a beat. Palpitations are usually not a serious problem. They may be caused by many things, including smoking, caffeine, alcohol, stress, and  certain medicines or drugs. Most causes of palpitations are not serious. However, some palpitations can be a sign of a serious problem. You may need further tests to rule out serious medical problems. Follow these instructions at home:     Pay attention to any changes in your condition. Take these actions to help manage your symptoms: Eating and drinking  Avoid foods and drinks that may cause palpitations. These may include: ? Caffeinated coffee, tea, soft drinks, diet pills, and energy drinks. ? Chocolate. ? Alcohol. Lifestyle  Take steps to reduce your stress and anxiety. Things that can help you relax include: ? Yoga. ? Mind-body activities, such as deep breathing, meditation, or using words and images to create positive thoughts (guided imagery). ? Physical activity, such as swimming, jogging, or walking. Tell your health care provider if your palpitations increase with activity. If you have chest pain or shortness of breath with activity, do not continue the activity until you are seen by your health care provider. ? Biofeedback. This is a method that helps you learn to use your mind to control things in your body, such as your heartbeat.  Do not use drugs, including cocaine or ecstasy. Do not use marijuana.  Get plenty of rest and sleep. Keep a regular bed time. General instructions  Take over-the-counter and prescription medicines  only as told by your health care provider.  Do not use any products that contain nicotine or tobacco, such as cigarettes and e-cigarettes. If you need help quitting, ask your health care provider.  Keep all follow-up visits as told by your health care provider. This is important. These may include visits for further testing if palpitations do not go away or get worse. Contact a health care provider if you:  Continue to have a fast or irregular heartbeat after 24 hours.  Notice that your palpitations occur more often. Get help right away if  you:  Have chest pain or shortness of breath.  Have a severe headache.  Feel dizzy or you faint. Summary  Palpitations are feelings that your heartbeat is irregular or is faster than normal. It may feel like your heart is fluttering or skipping a beat.  Palpitations may be caused by many things, including smoking, caffeine, alcohol, stress, certain medicines, and drugs.  Although most causes of palpitations are not serious, some causes can be a sign of a serious medical problem.  Get help right away if you faint or have chest pain, shortness of breath, a severe headache, or dizziness. This information is not intended to replace advice given to you by your health care provider. Make sure you discuss any questions you have with your health care provider. Document Released: 03/22/2000 Document Revised: 05/07/2017 Document Reviewed: 05/07/2017 Elsevier Patient Education  [employer]2020 [employer]    If you have lab work done today you will be contacted with your lab results within the next 2 weeks.  If you have not heard from us then please contact us. The fastest way to get your results is to register for My Chart.   IF you received an x-ray today, you will receive an invoice from [facility]. Please contact [facility] at [phone] with questions or concerns regarding your invoice.   IF you received labwork today, you will receive an invoice from [facility]. Please contact [facility] at [phone] with questions or concerns regarding your invoice.   Our billing staff will not be able to assist you with questions regarding bills from these companies.  You will be contacted with the lab results as soon as they are available. The fastest way to get your results is to activate your My Chart account. Instructions are located on the last page of this paperwork. If you have not heard from us regarding the results in 2 weeks, please contact this office.

## 2019-01-14 LAB — TSH: TSH: 6.3 u[IU]/mL — ABNORMAL HIGH (ref 0.450–4.500)

## 2019-01-14 LAB — CBC
Hematocrit: 44.3 % (ref 37.5–51.0)
Hemoglobin: 14.8 g/dL (ref 13.0–17.7)
MCH: 29.8 pg (ref 26.6–33.0)
MCHC: 33.4 g/dL (ref 31.5–35.7)
MCV: 89 fL (ref 79–97)
Platelets: 300 10*3/uL (ref 150–450)
RBC: 4.96 x10E6/uL (ref 4.14–5.80)
RDW: 12.1 % (ref 11.6–15.4)
WBC: 5.2 10*3/uL (ref 3.4–10.8)

## 2019-01-14 LAB — COMPREHENSIVE METABOLIC PANEL
ALT: 26 IU/L (ref 0–44)
AST: 27 IU/L (ref 0–40)
Albumin/Globulin Ratio: 1.9 (ref 1.2–2.2)
Albumin: 4.7 g/dL (ref 4.0–5.0)
Alkaline Phosphatase: 39 IU/L (ref 39–117)
BUN/Creatinine Ratio: 9 (ref 9–20)
BUN: 11 mg/dL (ref 6–20)
Bilirubin Total: 0.2 mg/dL (ref 0.0–1.2)
CO2: 25 mmol/L (ref 20–29)
Calcium: 9.6 mg/dL (ref 8.7–10.2)
Chloride: 98 mmol/L (ref 96–106)
Creatinine, Ser: 1.18 mg/dL (ref 0.76–1.27)
GFR calc Af Amer: 94 mL/min/{1.73_m2} (ref >59)
GFR calc non Af Amer: 81 mL/min/{1.73_m2} (ref >59)
Globulin, Total: 2.5 g/dL (ref 1.5–4.5)
Glucose: 90 mg/dL (ref 65–99)
Potassium: 4.8 mmol/L (ref 3.5–5.2)
Sodium: 138 mmol/L (ref 134–144)
Total Protein: 7.2 g/dL (ref 6.0–8.5)

## 2019-01-19 DIAGNOSIS — Z20828 Contact with and (suspected) exposure to other viral communicable diseases: Secondary | ICD-10-CM | POA: Diagnosis not present

## 2019-02-26 ENCOUNTER — Other Ambulatory Visit: Payer: Self-pay

## 2019-02-26 ENCOUNTER — Ambulatory Visit (INDEPENDENT_AMBULATORY_CARE_PROVIDER_SITE_OTHER): Payer: 59 | Admitting: Family Medicine

## 2019-02-26 ENCOUNTER — Encounter: Payer: Self-pay | Admitting: Family Medicine

## 2019-02-26 VITALS — BP 124/75 | HR 70 | Temp 98.4°F | Wt 216.2 lb

## 2019-02-26 DIAGNOSIS — R69 Illness, unspecified: Secondary | ICD-10-CM | POA: Diagnosis not present

## 2019-02-26 DIAGNOSIS — R002 Palpitations: Secondary | ICD-10-CM

## 2019-02-26 DIAGNOSIS — G4700 Insomnia, unspecified: Secondary | ICD-10-CM | POA: Diagnosis not present

## 2019-02-26 DIAGNOSIS — Z0001 Encounter for general adult medical examination with abnormal findings: Secondary | ICD-10-CM

## 2019-02-26 DIAGNOSIS — Z0000 Encounter for general adult medical examination without abnormal findings: Secondary | ICD-10-CM

## 2019-02-26 DIAGNOSIS — R7989 Other specified abnormal findings of blood chemistry: Secondary | ICD-10-CM

## 2019-02-26 DIAGNOSIS — F411 Generalized anxiety disorder: Secondary | ICD-10-CM

## 2019-02-26 NOTE — Patient Instructions (Addendum)
If palpitations persist or more frequent episodes, I would recommend meeting with cardiology. Let me know.  See info on stress, and sleep below. Those can also affect heart palpitations.  Continue to watch caffeine intake.  Melatonin can sometimes be helpful with sleep but follow-up if continues to be an issue also follow up if increased anxiety. I would recommend keeping exercise to HR at 85% peak to see if that lessens symptoms. Return to the clinic or go to the nearest emergency room if any of your symptoms worsen or new symptoms occur.   Palpitations Palpitations are feelings that your heartbeat is irregular or is faster than normal. It may feel like your heart is fluttering or skipping a beat. Palpitations are usually not a serious problem. They may be caused by many things, including smoking, caffeine, alcohol, stress, and certain medicines or drugs. Most causes of palpitations are not serious. However, some palpitations can be a sign of a serious problem. You may need further tests to rule out serious medical problems. Follow these instructions at home:     Pay attention to any changes in your condition. Take these actions to help manage your symptoms: Eating and drinking  Avoid foods and drinks that may cause palpitations. These may include: ? Caffeinated coffee, tea, soft drinks, diet pills, and energy drinks. ? Chocolate. ? Alcohol. Lifestyle  Take steps to reduce your stress and anxiety. Things that can help you relax include: ? Yoga. ? Mind-body activities, such as deep breathing, meditation, or using words and images to create positive thoughts (guided imagery). ? Physical activity, such as swimming, jogging, or walking. Tell your health care provider if your palpitations increase with activity. If you have chest pain or shortness of breath with activity, do not continue the activity until you are seen by your health care provider. ? Biofeedback. This is a method that helps  you learn to use your mind to control things in your body, such as your heartbeat.  Do not use drugs, including cocaine or ecstasy. Do not use marijuana.  Get plenty of rest and sleep. Keep a regular bed time. General instructions  Take over-the-counter and prescription medicines only as told by your health care provider.  Do not use any products that contain nicotine or tobacco, such as cigarettes and e-cigarettes. If you need help quitting, ask your health care provider.  Keep all follow-up visits as told by your health care provider. This is important. These may include visits for further testing if palpitations do not go away or get worse. Contact a health care provider if you:  Continue to have a fast or irregular heartbeat after 24 hours.  Notice that your palpitations occur more often. Get help right away if you:  Have chest pain or shortness of breath.  Have a severe headache.  Feel dizzy or you faint. Summary  Palpitations are feelings that your heartbeat is irregular or is faster than normal. It may feel like your heart is fluttering or skipping a beat.  Palpitations may be caused by many things, including smoking, caffeine, alcohol, stress, certain medicines, and drugs.  Although most causes of palpitations are not serious, some causes can be a sign of a serious medical problem.  Get help right away if you faint or have chest pain, shortness of breath, a severe headache, or dizziness. This information is not intended to replace advice given to you by your health care provider. Make sure you discuss any questions you have with your  health care provider. Document Released: 03/22/2000 Document Revised: 05/07/2017 Document Reviewed: 05/07/2017 Elsevier Patient Education  2020 [place].  Insomnia Insomnia is a sleep disorder that makes it difficult to fall asleep or stay asleep. Insomnia can cause fatigue, low energy, difficulty concentrating, mood swings, and poor  performance at work or school. There are three different ways to classify insomnia:  Difficulty falling asleep.  Difficulty staying asleep.  Waking up too early in the morning. Any type of insomnia can be long-term (chronic) or short-term (acute). Both are common. Short-term insomnia usually lasts for three months or less. Chronic insomnia occurs at least three times a week for longer than three months. What are the causes? Insomnia may be caused by another condition, situation, or substance, such as:  Anxiety.  Certain medicines.  Gastroesophageal reflux disease (GERD) or other gastrointestinal conditions.  Asthma or other breathing conditions.  Restless legs syndrome, sleep apnea, or other sleep disorders.  Chronic pain.  Menopause.  Stroke.  Abuse of alcohol, tobacco, or illegal drugs.  Mental health conditions, such as depression.  Caffeine.  Neurological disorders, such as Alzheimer's disease.  An overactive thyroid (hyperthyroidism). Sometimes, the cause of insomnia may not be known. What increases the risk? Risk factors for insomnia include:  Gender. Women are affected more often than men.  Age. Insomnia is more common as you get older.  Stress.  Lack of exercise.  Irregular work schedule or working night shifts.  Traveling between different time zones.  Certain medical and mental health conditions. What are the signs or symptoms? If you have insomnia, the main symptom is having trouble falling asleep or having trouble staying asleep. This may lead to other symptoms, such as:  Feeling fatigued or having low energy.  Feeling nervous about going to sleep.  Not feeling rested in the morning.  Having trouble concentrating.  Feeling irritable, anxious, or depressed. How is this diagnosed? This condition may be diagnosed based on:  Your symptoms and medical history. Your health care provider may ask about: ? Your sleep habits. ? Any medical  conditions you have. ? Your mental health.  A physical exam. How is this treated? Treatment for insomnia depends on the cause. Treatment may focus on treating an underlying condition that is causing insomnia. Treatment may also include:  Medicines to help you sleep.  Counseling or therapy.  Lifestyle adjustments to help you sleep better. Follow these instructions at home: Eating and drinking   Limit or avoid alcohol, caffeinated beverages, and cigarettes, especially close to bedtime. These can disrupt your sleep.  Do not eat a large meal or eat spicy foods right before bedtime. This can lead to digestive discomfort that can make it hard for you to sleep. Sleep habits   Keep a sleep diary to help you and your health care provider figure out what could be causing your insomnia. Write down: ? When you sleep. ? When you wake up during the night. ? How well you sleep. ? How rested you feel the next day. ? Any side effects of medicines you are taking. ? What you eat and drink.  Make your bedroom a dark, comfortable place where it is easy to fall asleep. ? Put up shades or blackout curtains to block light from outside. ? Use a white noise machine to block noise. ? Keep the temperature cool.  Limit screen use before bedtime. This includes: ? Watching TV. ? Using your smartphone, tablet, or computer.  Stick to a routine that includes going  to bed and waking up at the same times every day and night. This can help you fall asleep faster. Consider making a quiet activity, such as reading, part of your nighttime routine.  Try to avoid taking naps during the day so that you sleep better at night.  Get out of bed if you are still awake after 15 minutes of trying to sleep. Keep the lights down, but try reading or doing a quiet activity. When you feel sleepy, go back to bed. General instructions  Take over-the-counter and prescription medicines only as told by your health care  provider.  Exercise regularly, as told by your health care provider. Avoid exercise starting several hours before bedtime.  Use relaxation techniques to manage stress. Ask your health care provider to suggest some techniques that may work well for you. These may include: ? Breathing exercises. ? Routines to release muscle tension. ? Visualizing peaceful scenes.  Make sure that you drive carefully. Avoid driving if you feel very sleepy.  Keep all follow-up visits as told by your health care provider. This is important. Contact a health care provider if:  You are tired throughout the day.  You have trouble in your daily routine due to sleepiness.  You continue to have sleep problems, or your sleep problems get worse. Get help right away if:  You have serious thoughts about hurting yourself or someone else. If you ever feel like you may hurt yourself or others, or have thoughts about taking your own life, get help right away. You can go to your nearest emergency department or call:  Your local emergency services (911 in the [place]  A suicide crisis helpline, such as the [place] at 604 196 6319. This is open 24 hours a day. Summary  Insomnia is a sleep disorder that makes it difficult to fall asleep or stay asleep.  Insomnia can be long-term (chronic) or short-term (acute).  Treatment for insomnia depends on the cause. Treatment may focus on treating an underlying condition that is causing insomnia.  Keep a sleep diary to help you and your health care provider figure out what could be causing your insomnia. This information is not intended to replace advice given to you by your health care provider. Make sure you discuss any questions you have with your health care provider. Document Released: 03/22/2000 Document Revised: 03/07/2017 Document Reviewed: 01/02/2017 Elsevier Patient Education  2020 [place] is a normal reaction to  life events. Stress is what you feel when life demands more than you are used to, or more than you think you can handle. Some stress can be useful, such as studying for a test or meeting a deadline at work. Stress that occurs too often or for too long can cause problems. It can affect your emotional health and interfere with relationships and normal daily activities. Too much stress can weaken your body's defense system (immune system) and increase your risk for physical illness. If you already have a medical problem, stress can make it worse. What are the causes? All sorts of life events can cause stress. An event that causes stress for one person may not be stressful for another person. Major life events, whether positive or negative, commonly cause stress. Examples include:  Losing a job or starting a new job.  Losing a loved one.  Moving to a new town or home.  Getting married or divorced.  Having a baby.  Injury or illness. Less obvious life events can  also cause stress, especially if they occur day after day or in combination with each other. Examples include:  Working long hours.  Driving in traffic.  Caring for children.  Being in debt.  Being in a difficult relationship. What are the signs or symptoms? Stress can cause emotional symptoms, including:  Anxiety. This is feeling worried, afraid, on edge, overwhelmed, or out of control.  Anger, including irritation or impatience.  Depression. This is feeling sad, down, helpless, or guilty.  Trouble focusing, remembering, or making decisions. Stress can cause physical symptoms, including:  Aches and pains. These may affect your head, neck, back, stomach, or other areas of your body.  Tight muscles or a clenched jaw.  Low energy.  Trouble sleeping. Stress can cause unhealthy behaviors, including:  Eating to feel better (overeating) or skipping meals.  Working too much or putting off tasks.  Smoking, drinking  alcohol, or using drugs to feel better. How is this diagnosed? Stress is diagnosed through an assessment by your health care provider. He or she may diagnose this condition based on:  Your symptoms and any stressful life events.  Your medical history.  Tests to rule out other causes of your symptoms. Depending on your condition, your health care provider may refer you to a specialist for further evaluation. How is this treated?  Stress management techniques are the recommended treatment for stress. Medicine is not typically recommended for the treatment of stress. Techniques to reduce your reaction to stressful life events include:  Stress identification. Monitor yourself for symptoms of stress and identify what causes stress for you. These skills may help you to avoid or prepare for stressful events.  Time management. Set your priorities, keep a calendar of events, and learn to say "no." Taking these actions can help you avoid making too many commitments. Techniques for coping with stress include:  Rethinking the problem. Try to think realistically about stressful events rather than ignoring them or overreacting. Try to find the positives in a stressful situation rather than focusing on the negatives.  Exercise. Physical exercise can release both physical and emotional tension. The key is to find a form of exercise that you enjoy and do it regularly.  Relaxation techniques. These relax the body and mind. The key is to find one or more that you enjoy and use the technique(s) regularly. Examples include: ? Meditation, deep breathing, or progressive relaxation techniques. ? Yoga or tai chi. ? Biofeedback, mindfulness techniques, or journaling. ? Listening to music, being out in nature, or participating in other hobbies.  Practicing a healthy lifestyle. Eat a balanced diet, drink plenty of water, limit or avoid caffeine, and get plenty of sleep.  Having a strong support network. Spend  time with family, friends, or other people you enjoy being around. Express your feelings and talk things over with someone you trust. Counseling or talk therapy with a mental health professional may be helpful if you are having trouble managing stress on your own. Follow these instructions at home: Lifestyle   Avoid drugs.  Do not use any products that contain nicotine or tobacco, such as cigarettes and e-cigarettes. If you need help quitting, ask your health care provider.  Limit alcohol intake to no more than 1 drink a day for nonpregnant women and 2 drinks a day for men. One drink equals 12 oz of beer, 5 oz of wine, or 1 oz of hard liquor.  Do not use alcohol or drugs to relax.  Eat a balanced diet that  includes fresh fruits and vegetables, whole grains, lean meats, fish, eggs, and beans, and low-fat dairy. Avoid processed foods and foods high in added fat, sugar, and salt.  Exercise at least 30 minutes on 5 or more days each week.  Get 7-8 hours of sleep each night. General instructions   Practice stress management techniques as discussed with your health care provider.  Drink enough fluid to keep your urine clear or pale yellow.  Take over-the-counter and prescription medicines only as told by your health care provider.  Keep all follow-up visits as told by your health care provider. This is important. Contact a health care provider if:  Your symptoms get worse.  You have new symptoms.  You feel overwhelmed by your problems and can no longer manage them on your own. Get help right away if:  You have thoughts of hurting yourself or others. If you ever feel like you may hurt yourself or others, or have thoughts about taking your own life, get help right away. You can go to your nearest emergency department or call:  Your local emergency services (911 in the [place]  A suicide crisis helpline, such as the [place] at 913-484-1508. This is  open 24 hours a day. Summary  Stress is a normal reaction to life events. It can cause problems if it happens too often or for too long.  Practicing stress management techniques is the best way to treat stress.  Counseling or talk therapy with a mental health professional may be helpful if you are having trouble managing stress on your own. This information is not intended to replace advice given to you by your health care provider. Make sure you discuss any questions you have with your health care provider. Document Released: 09/18/2000 Document Revised: 03/07/2017 Document Reviewed: 05/15/2016 Elsevier Patient Education  [employer].   If you have lab work done today you will be contacted with your lab results within the next 2 weeks.  If you have not heard from [place] then please contact us. The fastest way to get your results is to register for My Chart.   Keeping you healthy  Get these tests  Blood pressure- Have your blood pressure checked once a year by your healthcare provider.  Normal blood pressure is 120/80.  Weight- Have your body mass index (BMI) calculated to screen for obesity.  BMI is a measure of body fat based on height and weight. You can also calculate your own BMI at [employer].  Cholesterol- Have your cholesterol checked regularly starting at age 34, sooner may be necessary if you have diabetes, high blood pressure, if a family member developed heart diseases at an early age or if you smoke.   Chlamydia, HIV, and other sexual transmitted disease- Get screened each year until the age of 90 then within three months of each new sexual partner.  Diabetes- Have your blood sugar checked regularly if you have high blood pressure, high cholesterol, a family history of diabetes or if you are overweight.  Get these vaccines  Flu shot- Every fall.  Tetanus shot- Every 10 years.  Menactra- Single dose; prevents meningitis.  Take these steps  Don't  smoke- If you do smoke, ask your healthcare provider about quitting. For tips on how to quit, go to www.smokefree.gov or call 1-800-QUIT-NOW.  Be physically active- Exercise 5 days a week for at least 30 minutes.  If you are not already physically active start slow and gradually work up to  30 minutes of moderate physical activity.  Examples of moderate activity include walking briskly, mowing the yard, dancing, swimming bicycling, etc.  Eat a healthy diet- Eat a variety of healthy foods such as fruits, vegetables, low fat milk, low fat cheese, yogurt, lean meats, poultry, fish, beans, tofu, etc.  For more information on healthy eating, go to www.thenutritionsource.org  Drink alcohol in moderation- Limit alcohol intake two drinks or less a day.  Never drink and drive.  Dentist- Brush and floss teeth twice daily; visit your dentis twice a year.  Depression-Your emotional health is as important as your physical health.  If you're feeling down, losing interest in things you normally enjoy please talk with your healthcare provider.  Gun Safety- If you keep a gun in your home, keep it unloaded and with the safety lock on.  Bullets should be stored separately.  Helmet use- Always wear a helmet when riding a motorcycle, bicycle, rollerblading or skateboarding.  Safe sex- If you may be exposed to a sexually transmitted infection, use a condom  Seat belts- Seat bels can save your life; always wear one.  Smoke/Carbon Monoxide detectors- These detectors need to be installed on the appropriate level of your home.  Replace batteries at least once a year.  Skin Cancer- When out in the sun, cover up and use sunscreen SPF 15 or higher.  Violence- If anyone is threatening or hurting you, please tell your healthcare provider.  IF you received an x-ray today, you will receive an invoice from [facility]. Please contact [facility] at [phone] with questions or concerns regarding your  invoice.   IF you received labwork today, you will receive an invoice from [facility]. Please contact [facility] at [phone] with questions or concerns regarding your invoice.   Our billing staff will not be able to assist you with questions regarding bills from these companies.  You will be contacted with the lab results as soon as they are available. The fastest way to get your results is to activate your My Chart account. Instructions are located on the last page of this paperwork. If you have not heard from [place] regarding the results in 2 weeks, please contact this office.

## 2019-02-26 NOTE — Progress Notes (Signed)
Subjective:  Patient ID: Angel Baxter, male    DOB: 1985/08/24  Age: 33 y.o. MRN: [id number]  CC:  Chief Complaint  Patient presents with   Annual Exam    Here for annual exam and f/u form labs done in Oct    HPI Angel Baxter presents for  Annual exam, follow up PVC.   Palpitations: Discussed in October.  Recommended to maintain hydration, decrease caffeine and caution with use of supplements.  Option of cardiology eval if persistent.  TSH borderline elevated at 6.3, CBC,CMP were normal. Noticed a few few weeks ago for few days only, then went away. Has noticed during recovery after hard workouts with HR in 190's. (calcualted peak HR 188).   Some decrease in caffeine. Has not changed supplements. Resting heartrate in 90's at night until bedtime. Usually after dinner. Prior in 52's.  Some caffeine at work, pre workouts around lunch. Not in evening.  History of anxiety - some increase in past 6-8 months. Not interfering with work/activities. Some difficulty with sleep - longstanding - no treatments. No prior meds for anxiety - does not want to be on meds. No prior therapy. Some increased stress with pandemic, political climate.     Constitutional: Negative for fatigue and unexpected weight change.  Eyes: Negative for visual disturbance.  Respiratory: Negative for cough, chest tightness and shortness of breath.   Cardiovascular: Negative for chest pain, palpitations and leg swelling.  Gastrointestinal: Negative for abdominal pain and blood in stool.  Neurological: Negative for dizziness, light-headedness and headaches.   FH:  Grandfather - lung, throat CA, smoker.  Mom with depression/anxiety.  No other family illness.    Lipids and other screening labs through [facility] every year or two - ok.   STI testing - declines. Married, monogamous. Spouse is brewer, back in school  - plans on BSN.    There is no immunization history on file for this patient. Influenza: 02/19/19 at  work. [employer].  Tetanus: unknown, but less than 10 yrs.   Depression screen [facility] 2/9 02/26/2019 01/13/2019  Decreased Interest 0 0  Down, Depressed, Hopeless 0 0  PHQ - 2 Score 0 0   GAD 7 : Generalized Anxiety Score 02/26/2019  Nervous, Anxious, on Edge 2  Control/stop worrying 1  Worry too much - different things 2  Trouble relaxing 3  Restless 1  Easily annoyed or irritable 1  Afraid - awful might happen 1  Total GAD 7 Score 11       Hearing Screening   125Hz 250Hz 500Hz 1000Hz 2000Hz 3000Hz 4000Hz 6000Hz 8000Hz  Right ear:           Left ear:             Visual Acuity Screening   Right eye Left eye Both eyes  Without correction:     With correction: 20/13 20/13 20/13  contact lenses. No recent appt. Plans on scheduling appt. eval last spring for contacts.   Dental: every 6 months.   Exercise: daily. Usually 80mn-2hr  History There are no active problems to display for this patient.  Past Medical History:  Diagnosis Date   Anxiety    No past surgical history on file. No Known Allergies Prior to Admission medications   Not on File   Social History   Socioeconomic History   Marital status: Significant Other    Spouse name: Not on file   Number of children: Not on file  Years of education: Not on file   Highest education level: Not on file  Occupational History   Not on file  Social Needs   Financial resource strain: Not on file   Food insecurity    Worry: Not on file    Inability: Not on file   Transportation needs    Medical: Not on file    Non-medical: Not on file  Tobacco Use   Smoking status: Never Smoker   Smokeless tobacco: Never Used  Substance and Sexual Activity   Alcohol use: Never    Alcohol/week: 0.0 standard drinks    Frequency: Never   Drug use: Never   Sexual activity: Not on file  Lifestyle   Physical activity    Days per week: Not on file    Minutes per session: Not on file   Stress: Not on file    Relationships   Social connections    Talks on phone: Not on file    Gets together: Not on file    Attends religious service: Not on file    Active member of club or organization: Not on file    Attends meetings of clubs or organizations: Not on file    Relationship status: Not on file   Intimate partner violence    Fear of current or ex partner: Not on file    Emotionally abused: Not on file    Physically abused: Not on file    Forced sexual activity: Not on file  Other Topics Concern   Not on file  Social History Narrative   Not on file    Review of Systems 13 point review of systems per patient health survey noted.  Negative other than as indicated above or in HPI.    Objective:   Vitals:   02/26/19 0915  BP: 124/75  Pulse: 70  Temp: 98.4 F (36.9 C)  TempSrc: Oral  SpO2: 97%  Weight: 216 lb 3.2 oz (98.1 kg)    Physical Exam Vitals signs reviewed.  Constitutional:      Appearance: He is well-developed.  HENT:     Head: Normocephalic and atraumatic.     Right Ear: External ear normal.     Left Ear: External ear normal.  Eyes:     Conjunctiva/sclera: Conjunctivae normal.     Pupils: Pupils are equal, round, and reactive to light.  Neck:     Musculoskeletal: Normal range of motion and neck supple.     Thyroid: No thyromegaly.  Cardiovascular:     Rate and Rhythm: Normal rate and regular rhythm.     Heart sounds: Normal heart sounds.  Pulmonary:     Effort: Pulmonary effort is normal. No respiratory distress.     Breath sounds: Normal breath sounds. No wheezing.  Abdominal:     General: There is no distension.     Palpations: Abdomen is soft.     Tenderness: There is no abdominal tenderness.  Musculoskeletal: Normal range of motion.        General: No tenderness.  Lymphadenopathy:     Cervical: No cervical adenopathy.  Skin:    General: Skin is warm and dry.  Neurological:     Mental Status: He is alert and oriented to person, place, and time.      Deep Tendon Reflexes: Reflexes are normal and symmetric.  Psychiatric:        Behavior: Behavior normal.      Assessment & Plan:  Angel Baxter is a 33 y.o.  male . Annual physical exam  --anticipatory guidance as below in AVS, screening labs above. Health maintenance items as above in HPI discussed/recommended as applicable.   Palpitations  - possibly multifactorial.  Stress, anxiety, insomnia possible contributors.  Borderline TSH previously, repeating today.  Also recommended to avoid exercise beyond 85 to 90% peak heart rate as recovery tends to bring on palpitations.   Consider cardiology eval if persistent or worsening  Anxiety state Insomnia, unspecified type  -Handout given, RTC precautions if more intrusive, or not improving with melatonin. Declined treatment for anxiety at this time.    Elevated TSH - Plan: TSH + free T4  - repeat testing.    No orders of the defined types were placed in this encounter.  Patient Instructions     If palpitations persist or more frequent episodes, I would recommend meeting with cardiology. Let me know.  See info on stress, and sleep below. Those can also affect heart palpitations.  Continue to watch caffeine intake.  Melatonin can sometimes be helpful with sleep but follow-up if continues to be an issue also follow up if increased anxiety. I would recommend keeping exercise to HR at 85% peak to see if that lessens symptoms. Return to the clinic or go to the nearest emergency room if any of your symptoms worsen or new symptoms occur.   Palpitations Palpitations are feelings that your heartbeat is irregular or is faster than normal. It may feel like your heart is fluttering or skipping a beat. Palpitations are usually not a serious problem. They may be caused by many things, including smoking, caffeine, alcohol, stress, and certain medicines or drugs. Most causes of palpitations are not serious. However, some palpitations can be a sign  of a serious problem. You may need further tests to rule out serious medical problems. Follow these instructions at home:     Pay attention to any changes in your condition. Take these actions to help manage your symptoms: Eating and drinking  Avoid foods and drinks that may cause palpitations. These may include: ? Caffeinated coffee, tea, soft drinks, diet pills, and energy drinks. ? Chocolate. ? Alcohol. Lifestyle  Take steps to reduce your stress and anxiety. Things that can help you relax include: ? Yoga. ? Mind-body activities, such as deep breathing, meditation, or using words and images to create positive thoughts (guided imagery). ? Physical activity, such as swimming, jogging, or walking. Tell your health care provider if your palpitations increase with activity. If you have chest pain or shortness of breath with activity, do not continue the activity until you are seen by your health care provider. ? Biofeedback. This is a method that helps you learn to use your mind to control things in your body, such as your heartbeat.  Do not use drugs, including cocaine or ecstasy. Do not use marijuana.  Get plenty of rest and sleep. Keep a regular bed time. General instructions  Take over-the-counter and prescription medicines only as told by your health care provider.  Do not use any products that contain nicotine or tobacco, such as cigarettes and e-cigarettes. If you need help quitting, ask your health care provider.  Keep all follow-up visits as told by your health care provider. This is important. These may include visits for further testing if palpitations do not go away or get worse. Contact a health care provider if you:  Continue to have a fast or irregular heartbeat after 24 hours.  Notice that your palpitations occur more often. Get  help right away if you:  Have chest pain or shortness of breath.  Have a severe headache.  Feel dizzy or you  faint. Summary  Palpitations are feelings that your heartbeat is irregular or is faster than normal. It may feel like your heart is fluttering or skipping a beat.  Palpitations may be caused by many things, including smoking, caffeine, alcohol, stress, certain medicines, and drugs.  Although most causes of palpitations are not serious, some causes can be a sign of a serious medical problem.  Get help right away if you faint or have chest pain, shortness of breath, a severe headache, or dizziness. This information is not intended to replace advice given to you by your health care provider. Make sure you discuss any questions you have with your health care provider. Document Released: 03/22/2000 Document Revised: 05/07/2017 Document Reviewed: 05/07/2017 Elsevier Patient Education  2020 [place].  Insomnia Insomnia is a sleep disorder that makes it difficult to fall asleep or stay asleep. Insomnia can cause fatigue, low energy, difficulty concentrating, mood swings, and poor performance at work or school. There are three different ways to classify insomnia:  Difficulty falling asleep.  Difficulty staying asleep.  Waking up too early in the morning. Any type of insomnia can be long-term (chronic) or short-term (acute). Both are common. Short-term insomnia usually lasts for three months or less. Chronic insomnia occurs at least three times a week for longer than three months. What are the causes? Insomnia may be caused by another condition, situation, or substance, such as:  Anxiety.  Certain medicines.  Gastroesophageal reflux disease (GERD) or other gastrointestinal conditions.  Asthma or other breathing conditions.  Restless legs syndrome, sleep apnea, or other sleep disorders.  Chronic pain.  Menopause.  Stroke.  Abuse of alcohol, tobacco, or illegal drugs.  Mental health conditions, such as depression.  Caffeine.  Neurological disorders, such as Alzheimer's  disease.  An overactive thyroid (hyperthyroidism). Sometimes, the cause of insomnia may not be known. What increases the risk? Risk factors for insomnia include:  Gender. Women are affected more often than men.  Age. Insomnia is more common as you get older.  Stress.  Lack of exercise.  Irregular work schedule or working night shifts.  Traveling between different time zones.  Certain medical and mental health conditions. What are the signs or symptoms? If you have insomnia, the main symptom is having trouble falling asleep or having trouble staying asleep. This may lead to other symptoms, such as:  Feeling fatigued or having low energy.  Feeling nervous about going to sleep.  Not feeling rested in the morning.  Having trouble concentrating.  Feeling irritable, anxious, or depressed. How is this diagnosed? This condition may be diagnosed based on:  Your symptoms and medical history. Your health care provider may ask about: ? Your sleep habits. ? Any medical conditions you have. ? Your mental health.  A physical exam. How is this treated? Treatment for insomnia depends on the cause. Treatment may focus on treating an underlying condition that is causing insomnia. Treatment may also include:  Medicines to help you sleep.  Counseling or therapy.  Lifestyle adjustments to help you sleep better. Follow these instructions at home: Eating and drinking   Limit or avoid alcohol, caffeinated beverages, and cigarettes, especially close to bedtime. These can disrupt your sleep.  Do not eat a large meal or eat spicy foods right before bedtime. This can lead to digestive discomfort that can make it hard for you to  sleep. Sleep habits   Keep a sleep diary to help you and your health care provider figure out what could be causing your insomnia. Write down: ? When you sleep. ? When you wake up during the night. ? How well you sleep. ? How rested you feel the next  day. ? Any side effects of medicines you are taking. ? What you eat and drink.  Make your bedroom a dark, comfortable place where it is easy to fall asleep. ? Put up shades or blackout curtains to block light from outside. ? Use a white noise machine to block noise. ? Keep the temperature cool.  Limit screen use before bedtime. This includes: ? Watching TV. ? Using your smartphone, tablet, or computer.  Stick to a routine that includes going to bed and waking up at the same times every day and night. This can help you fall asleep faster. Consider making a quiet activity, such as reading, part of your nighttime routine.  Try to avoid taking naps during the day so that you sleep better at night.  Get out of bed if you are still awake after 15 minutes of trying to sleep. Keep the lights down, but try reading or doing a quiet activity. When you feel sleepy, go back to bed. General instructions  Take over-the-counter and prescription medicines only as told by your health care provider.  Exercise regularly, as told by your health care provider. Avoid exercise starting several hours before bedtime.  Use relaxation techniques to manage stress. Ask your health care provider to suggest some techniques that may work well for you. These may include: ? Breathing exercises. ? Routines to release muscle tension. ? Visualizing peaceful scenes.  Make sure that you drive carefully. Avoid driving if you feel very sleepy.  Keep all follow-up visits as told by your health care provider. This is important. Contact a health care provider if:  You are tired throughout the day.  You have trouble in your daily routine due to sleepiness.  You continue to have sleep problems, or your sleep problems get worse. Get help right away if:  You have serious thoughts about hurting yourself or someone else. If you ever feel like you may hurt yourself or others, or have thoughts about taking your own life, get  help right away. You can go to your nearest emergency department or call:  Your local emergency services (911 in the [place]  A suicide crisis helpline, such as the [place] at 571-106-9907. This is open 24 hours a day. Summary  Insomnia is a sleep disorder that makes it difficult to fall asleep or stay asleep.  Insomnia can be long-term (chronic) or short-term (acute).  Treatment for insomnia depends on the cause. Treatment may focus on treating an underlying condition that is causing insomnia.  Keep a sleep diary to help you and your health care provider figure out what could be causing your insomnia. This information is not intended to replace advice given to you by your health care provider. Make sure you discuss any questions you have with your health care provider. Document Released: 03/22/2000 Document Revised: 03/07/2017 Document Reviewed: 01/02/2017 Elsevier Patient Education  2020 [place] is a normal reaction to life events. Stress is what you feel when life demands more than you are used to, or more than you think you can handle. Some stress can be useful, such as studying for a test or meeting a deadline at work. Stress  that occurs too often or for too long can cause problems. It can affect your emotional health and interfere with relationships and normal daily activities. Too much stress can weaken your body's defense system (immune system) and increase your risk for physical illness. If you already have a medical problem, stress can make it worse. What are the causes? All sorts of life events can cause stress. An event that causes stress for one person may not be stressful for another person. Major life events, whether positive or negative, commonly cause stress. Examples include:  Losing a job or starting a new job.  Losing a loved one.  Moving to a new town or home.  Getting married or divorced.  Having a  baby.  Injury or illness. Less obvious life events can also cause stress, especially if they occur day after day or in combination with each other. Examples include:  Working long hours.  Driving in traffic.  Caring for children.  Being in debt.  Being in a difficult relationship. What are the signs or symptoms? Stress can cause emotional symptoms, including:  Anxiety. This is feeling worried, afraid, on edge, overwhelmed, or out of control.  Anger, including irritation or impatience.  Depression. This is feeling sad, down, helpless, or guilty.  Trouble focusing, remembering, or making decisions. Stress can cause physical symptoms, including:  Aches and pains. These may affect your head, neck, back, stomach, or other areas of your body.  Tight muscles or a clenched jaw.  Low energy.  Trouble sleeping. Stress can cause unhealthy behaviors, including:  Eating to feel better (overeating) or skipping meals.  Working too much or putting off tasks.  Smoking, drinking alcohol, or using drugs to feel better. How is this diagnosed? Stress is diagnosed through an assessment by your health care provider. He or she may diagnose this condition based on:  Your symptoms and any stressful life events.  Your medical history.  Tests to rule out other causes of your symptoms. Depending on your condition, your health care provider may refer you to a specialist for further evaluation. How is this treated?  Stress management techniques are the recommended treatment for stress. Medicine is not typically recommended for the treatment of stress. Techniques to reduce your reaction to stressful life events include:  Stress identification. Monitor yourself for symptoms of stress and identify what causes stress for you. These skills may help you to avoid or prepare for stressful events.  Time management. Set your priorities, keep a calendar of events, and learn to say no. Taking these  actions can help you avoid making too many commitments. Techniques for coping with stress include:  Rethinking the problem. Try to think realistically about stressful events rather than ignoring them or overreacting. Try to find the positives in a stressful situation rather than focusing on the negatives.  Exercise. Physical exercise can release both physical and emotional tension. The key is to find a form of exercise that you enjoy and do it regularly.  Relaxation techniques. These relax the body and mind. The key is to find one or more that you enjoy and use the technique(s) regularly. Examples include: ? Meditation, deep breathing, or progressive relaxation techniques. ? Yoga or tai chi. ? Biofeedback, mindfulness techniques, or journaling. ? Listening to music, being out in nature, or participating in other hobbies.  Practicing a healthy lifestyle. Eat a balanced diet, drink plenty of water, limit or avoid caffeine, and get plenty of sleep.  Having a strong support network. Spend  time with family, friends, or other people you enjoy being around. Express your feelings and talk things over with someone you trust. Counseling or talk therapy with a mental health professional may be helpful if you are having trouble managing stress on your own. Follow these instructions at home: Lifestyle   Avoid drugs.  Do not use any products that contain nicotine or tobacco, such as cigarettes and e-cigarettes. If you need help quitting, ask your health care provider.  Limit alcohol intake to no more than 1 drink a day for nonpregnant women and 2 drinks a day for men. One drink equals 12 oz of beer, 5 oz of wine, or 1 oz of hard liquor.  Do not use alcohol or drugs to relax.  Eat a balanced diet that includes fresh fruits and vegetables, whole grains, lean meats, fish, eggs, and beans, and low-fat dairy. Avoid processed foods and foods high in added fat, sugar, and salt.  Exercise at least 30  minutes on 5 or more days each week.  Get 7-8 hours of sleep each night. General instructions   Practice stress management techniques as discussed with your health care provider.  Drink enough fluid to keep your urine clear or pale yellow.  Take over-the-counter and prescription medicines only as told by your health care provider.  Keep all follow-up visits as told by your health care provider. This is important. Contact a health care provider if:  Your symptoms get worse.  You have new symptoms.  You feel overwhelmed by your problems and can no longer manage them on your own. Get help right away if:  You have thoughts of hurting yourself or others. If you ever feel like you may hurt yourself or others, or have thoughts about taking your own life, get help right away. You can go to your nearest emergency department or call:  Your local emergency services (911 in the [place]  A suicide crisis helpline, such as the [place] at (918)567-9432. This is open 24 hours a day. Summary  Stress is a normal reaction to life events. It can cause problems if it happens too often or for too long.  Practicing stress management techniques is the best way to treat stress.  Counseling or talk therapy with a mental health professional may be helpful if you are having trouble managing stress on your own. This information is not intended to replace advice given to you by your health care provider. Make sure you discuss any questions you have with your health care provider. Document Released: 09/18/2000 Document Revised: 03/07/2017 Document Reviewed: 05/15/2016 Elsevier Patient Education  [employer].   If you have lab work done today you will be contacted with your lab results within the next 2 weeks.  If you have not heard from [place] then please contact us. The fastest way to get your results is to register for My Chart.   Keeping you healthy  Get these  tests  Blood pressure- Have your blood pressure checked once a year by your healthcare provider.  Normal blood pressure is 120/80.  Weight- Have your body mass index (BMI) calculated to screen for obesity.  BMI is a measure of body fat based on height and weight. You can also calculate your own BMI at [employer].  Cholesterol- Have your cholesterol checked regularly starting at age 66, sooner may be necessary if you have diabetes, high blood pressure, if a family member developed heart diseases at an early age or if  you smoke.   Chlamydia, HIV, and other sexual transmitted disease- Get screened each year until the age of 46 then within three months of each new sexual partner.  Diabetes- Have your blood sugar checked regularly if you have high blood pressure, high cholesterol, a family history of diabetes or if you are overweight.  Get these vaccines  Flu shot- Every fall.  Tetanus shot- Every 10 years.  Menactra- Single dose; prevents meningitis.  Take these steps  Don't smoke- If you do smoke, ask your healthcare provider about quitting. For tips on how to quit, go to www.smokefree.gov or call 1-800-QUIT-NOW.  Be physically active- Exercise 5 days a week for at least 30 minutes.  If you are not already physically active start slow and gradually work up to 30 minutes of moderate physical activity.  Examples of moderate activity include walking briskly, mowing the yard, dancing, swimming bicycling, etc.  Eat a healthy diet- Eat a variety of healthy foods such as fruits, vegetables, low fat milk, low fat cheese, yogurt, lean meats, poultry, fish, beans, tofu, etc.  For more information on healthy eating, go to www.thenutritionsource.org  Drink alcohol in moderation- Limit alcohol intake two drinks or less a day.  Never drink and drive.  Dentist- Brush and floss teeth twice daily; visit your dentis twice a year.  Depression-Your emotional health is as important as your  physical health.  If you're feeling down, losing interest in things you normally enjoy please talk with your healthcare provider.  Gun Safety- If you keep a gun in your home, keep it unloaded and with the safety lock on.  Bullets should be stored separately.  Helmet use- Always wear a helmet when riding a motorcycle, bicycle, rollerblading or skateboarding.  Safe sex- If you may be exposed to a sexually transmitted infection, use a condom  Seat belts- Seat bels can save your life; always wear one.  Smoke/Carbon Monoxide detectors- These detectors need to be installed on the appropriate level of your home.  Replace batteries at least once a year.  Skin Cancer- When out in the sun, cover up and use sunscreen SPF 15 or higher.  Violence- If anyone is threatening or hurting you, please tell your healthcare provider.  IF you received an x-ray today, you will receive an invoice from [facility]. Please contact [facility] at [phone] with questions or concerns regarding your invoice.   IF you received labwork today, you will receive an invoice from [facility]. Please contact [facility] at [phone] with questions or concerns regarding your invoice.   Our billing staff will not be able to assist you with questions regarding bills from these companies.  You will be contacted with the lab results as soon as they are available. The fastest way to get your results is to activate your My Chart account. Instructions are located on the last page of this paperwork. If you have not heard from [place] regarding the results in 2 weeks, please contact this office.          Signed, Merri Ray, MD [facility]

## 2019-02-27 LAB — TSH+FREE T4
Free T4: 1.48 ng/dL (ref 0.82–1.77)
TSH: 4.31 u[IU]/mL (ref 0.450–4.500)

## 2019-03-01 ENCOUNTER — Telehealth: Payer: Self-pay | Admitting: Family Medicine

## 2019-03-01 NOTE — Telephone Encounter (Signed)
Pt said he would like to go ahead with the prescription that was spoken about at cpe appt   Pharmacy  Use preferred on chart

## 2019-03-03 NOTE — Telephone Encounter (Signed)
Pt called in again in regard to a prescription that was supposed to be sent to him but has not. Please advise

## 2019-03-05 NOTE — Telephone Encounter (Signed)
Pt stated he still has not received the prescription and he would like an update. Pt requests call back.

## 2019-03-06 ENCOUNTER — Telehealth: Payer: 59 | Admitting: Physician Assistant

## 2019-03-06 ENCOUNTER — Encounter (INDEPENDENT_AMBULATORY_CARE_PROVIDER_SITE_OTHER): Payer: Self-pay

## 2019-03-06 DIAGNOSIS — Z20822 Contact with and (suspected) exposure to covid-19: Secondary | ICD-10-CM

## 2019-03-06 NOTE — Progress Notes (Signed)
Duplicate.  Erroneous encounter.

## 2019-03-06 NOTE — Progress Notes (Signed)
E-Visit for Corona Virus Screening   Your current symptoms could be consistent with the coronavirus.  Many health care providers can now test patients at their office but not all are.   has multiple testing sites. For information on our COVID testing locations and hours go to [email or web address]  Please quarantine yourself while awaiting your test results.  We are enrolling you in our MyChart Home [facility] for COVID19 . Daily you will receive a questionnaire within the MyChart website. Our COVID 19 response team willl be monitoriing your responses daily. Please continue good preventive care measures, including:  frequent hand-washing, avoid touching your face, cover coughs/sneezes, stay out of crowds and keep a 6 foot distance from others.    COVID-19 is a respiratory illness with symptoms that are similar to the flu. Symptoms are typically mild to moderate, but there have been cases of severe illness and death due to the virus. The following symptoms may appear 2-14 days after exposure: . Fever . Cough . Shortness of breath or difficulty breathing . Chills . Repeated shaking with chills . Muscle pain . Headache . Sore throat . New loss of taste or smell . Fatigue . Congestion or runny nose . Nausea or vomiting . Diarrhea  If you develop fever/cough/breathlessness, please stay home for 10 days with improving symptoms and until you have had 24 hours of no fever (without taking a fever reducer).  Go to the nearest hospital ED for assessment if fever/cough/breathlessness are severe or illness seems like a threat to life.  It is vitally important that if you feel that you have an infection such as this virus or any other virus that you stay home and away from places where you may spread it to others.  You should avoid contact with people age 65 and older.   You should wear a mask or cloth face covering over your nose and mouth if you must be around other  people or animals, including pets (even at home). Try to stay at least 6 feet away from other people. This will protect the people around you.   You may also take acetaminophen (Tylenol) as needed for fever.   Reduce your risk of any infection by using the same precautions used for avoiding the common cold or flu:  . Wash your hands often with soap and warm water for at least 20 seconds.  If soap and water are not readily available, use an alcohol-based hand sanitizer with at least 60% alcohol.  . If coughing or sneezing, cover your mouth and nose by coughing or sneezing into the elbow areas of your shirt or coat, into a tissue or into your sleeve (not your hands). . Avoid shaking hands with others and consider head nods or verbal greetings only. . Avoid touching your eyes, nose, or mouth with unwashed hands.  . Avoid close contact with people who are sick. . Avoid places or events with large numbers of people in one location, like concerts or sporting events. . Carefully consider travel plans you have or are making. . If you are planning any travel outside or inside the [place], visit the CDC's Travelers' Health webpage for the latest health notices. . If you have some symptoms but not all symptoms, continue to monitor at home and seek medical attention if your symptoms worsen. . If you are having a medical emergency, call 911.  HOME CARE . Only take medications as instructed by your medical team. . Drink plenty of fluids and   get plenty of rest. . A steam or ultrasonic humidifier can help if you have congestion.   GET HELP RIGHT AWAY IF YOU HAVE EMERGENCY WARNING SIGNS** FOR COVID-19. If you or someone is showing any of these signs seek emergency medical care immediately. Call 911 or proceed to your closest emergency facility if: . You develop worsening high fever. . Trouble breathing . Bluish lips or face . Persistent pain or pressure in the chest . New confusion . Inability to wake or stay  awake . You cough up blood. . Your symptoms become more severe  **This list is not all possible symptoms. Contact your medical provider for any symptoms that are sever or concerning to you.   MAKE SURE YOU   Understand these instructions.  Will watch your condition.  Will get help right away if you are not doing well or get worse.  Your e-visit answers were reviewed by a board certified advanced clinical practitioner to complete your personal care plan.  Depending on the condition, your plan could have included both over the counter or prescription medications.  If there is a problem please reply once you have received a response from your provider.  Your safety is important to us.  If you have drug allergies check your prescription carefully.    You can use MyChart to ask questions about today's visit, request a non-urgent call back, or ask for a work or school excuse for 24 hours related to this e-Visit. If it has been greater than 24 hours you will need to follow up with your provider, or enter a new e-Visit to address those concerns. You will get an e-mail in the next two days asking about your experience.  I hope that your e-visit has been valuable and will speed your recovery. Thank you for using e-visits.    

## 2019-03-06 NOTE — Progress Notes (Signed)
I have spent 5 minutes in review of e-visit questionnaire, review and updating patient chart, medical decision making and response to patient.   Adriann Thau Cody Edmonia Gonser, PA-C    

## 2019-03-09 ENCOUNTER — Encounter (INDEPENDENT_AMBULATORY_CARE_PROVIDER_SITE_OTHER): Payer: Self-pay

## 2019-11-04 ENCOUNTER — Other Ambulatory Visit: Payer: Self-pay

## 2019-11-04 ENCOUNTER — Encounter: Payer: Self-pay | Admitting: Family Medicine

## 2019-11-04 ENCOUNTER — Ambulatory Visit (INDEPENDENT_AMBULATORY_CARE_PROVIDER_SITE_OTHER): Payer: 59 | Admitting: Family Medicine

## 2019-11-04 ENCOUNTER — Ambulatory Visit (INDEPENDENT_AMBULATORY_CARE_PROVIDER_SITE_OTHER): Payer: 59

## 2019-11-04 VITALS — BP 124/74 | HR 73 | Temp 97.9°F | Ht 72.0 in | Wt 206.0 lb

## 2019-11-04 DIAGNOSIS — F411 Generalized anxiety disorder: Secondary | ICD-10-CM | POA: Diagnosis not present

## 2019-11-04 DIAGNOSIS — R69 Illness, unspecified: Secondary | ICD-10-CM | POA: Diagnosis not present

## 2019-11-04 DIAGNOSIS — R002 Palpitations: Secondary | ICD-10-CM | POA: Diagnosis not present

## 2019-11-04 DIAGNOSIS — M25562 Pain in left knee: Secondary | ICD-10-CM

## 2019-11-04 MED ORDER — BUSPIRONE HCL 5 MG PO TABS: 5.0000 mg | ORAL_TABLET | Freq: Two times a day (BID) | ORAL | 1 refills | Status: DC

## 2019-11-04 NOTE — Patient Instructions (Addendum)
Try buspar initially once per day for a few days then increase to twice per day.  Let me know how that is doing the next few weeks.  6-week follow-up via virtual visit is fine unless she would continue to have knee symptoms, been scheduled as an office.  I will let you know if there are concerns on your knee x-ray, but some of that pain may be due to the tendon from the muscle of the quadriceps.  Range of motion, stretches, topical ice over that area as needed short-term.  Follow-up if symptoms are worsening.  Thanks for coming in today and let me know if there are other questions.   Managing Anxiety, Adult After being diagnosed with an anxiety disorder, you may be relieved to know why you have felt or behaved a certain way. You may also feel overwhelmed about the treatment ahead and what it will mean for your life. With care and support, you can manage this condition and recover from it. How to manage lifestyle changes Managing stress and anxiety  Stress is your body's reaction to life changes and events, both good and bad. Most stress will last just a few hours, but stress can be ongoing and can lead to more than just stress. Although stress can play a major role in anxiety, it is not the same as anxiety. Stress is usually caused by something external, such as a deadline, test, or competition. Stress normally passes after the triggering event has ended.  Anxiety is caused by something internal, such as imagining a terrible outcome or worrying that something will go wrong that will devastate you. Anxiety often does not go away even after the triggering event is over, and it can become long-term (chronic) worry. It is important to understand the differences between stress and anxiety and to manage your stress effectively so that it does not lead to an anxious response. Talk with your health care provider or a counselor to learn more about reducing anxiety and stress. He or she may suggest tension  reduction techniques, such as:  Music therapy. This can include creating or listening to music that you enjoy and that inspires you.  Mindfulness-based meditation. This involves being aware of your normal breaths while not trying to control your breathing. It can be done while sitting or walking.  Centering prayer. This involves focusing on a word, phrase, or sacred image that means something to you and brings you peace.  Deep breathing. To do this, expand your stomach and inhale slowly through your nose. Hold your breath for 3-5 seconds. Then exhale slowly, letting your stomach muscles relax.  Self-talk. This involves identifying thought patterns that lead to anxiety reactions and changing those patterns.  Muscle relaxation. This involves tensing muscles and then relaxing them. Choose a tension reduction technique that suits your lifestyle and personality. These techniques take time and practice. Set aside 5-15 minutes a day to do them. Therapists can offer counseling and training in these techniques. The training to help with anxiety may be covered by some insurance plans. Other things you can do to manage stress and anxiety include:  Keeping a stress/anxiety diary. This can help you learn what triggers your reaction and then learn ways to manage your response.  Thinking about how you react to certain situations. You may not be able to control everything, but you can control your response.  Making time for activities that help you relax and not feeling guilty about spending your time in this way.  Visual imagery and yoga can help you stay calm and relax.  Medicines Medicines can help ease symptoms. Medicines for anxiety include:  Anti-anxiety drugs.  Antidepressants. Medicines are often used as a primary treatment for anxiety disorder. Medicines will be prescribed by a health care provider. When used together, medicines, psychotherapy, and tension reduction techniques may be the most  effective treatment. Relationships Relationships can play a big part in helping you recover. Try to spend more time connecting with trusted friends and family members. Consider going to couples counseling, taking family education classes, or going to family therapy. Therapy can help you and others better understand your condition. How to recognize changes in your anxiety Everyone responds differently to treatment for anxiety. Recovery from anxiety happens when symptoms decrease and stop interfering with your daily activities at home or work. This may mean that you will start to:  Have better concentration and focus. Worry will interfere less in your daily thinking.  Sleep better.  Be less irritable.  Have more energy.  Have improved memory. It is important to recognize when your condition is getting worse. Contact your health care provider if your symptoms interfere with home or work and you feel like your condition is not improving. Follow these instructions at home: Activity  Exercise. Most adults should do the following: ? Exercise for at least 150 minutes each week. The exercise should increase your heart rate and make you sweat (moderate-intensity exercise). ? Strengthening exercises at least twice a week.  Get the right amount and quality of sleep. Most adults need 7-9 hours of sleep each night. Lifestyle   Eat a healthy diet that includes plenty of vegetables, fruits, whole grains, low-fat dairy products, and lean protein. Do not eat a lot of foods that are high in solid fats, added sugars, or salt.  Make choices that simplify your life.  Do not use any products that contain nicotine or tobacco, such as cigarettes, e-cigarettes, and chewing tobacco. If you need help quitting, ask your health care provider.  Avoid caffeine, alcohol, and certain over-the-counter cold medicines. These may make you feel worse. Ask your pharmacist which medicines to avoid. General  instructions  Take over-the-counter and prescription medicines only as told by your health care provider.  Keep all follow-up visits as told by your health care provider. This is important. Where to find support You can get help and support from these sources:  Self-help groups.  Online and [employer].  A trusted spiritual leader.  Couples counseling.  Family education classes.  Family therapy. Where to find more information You may find that joining a support group helps you deal with your anxiety. The following sources can help you locate counselors or support groups near you:  Mental Health America: www.mentalhealthamerica.net  Anxiety and Depression Association of [place] (ADAA): ProgramCam.de  [employer] on Mental Illness (NAMI): www.nami.org Contact a health care provider if you:  Have a hard time staying focused or finishing daily tasks.  Spend many hours a day feeling worried about everyday life.  Become exhausted by worry.  Start to have headaches, feel tense, or have nausea.  Urinate more than normal.  Have diarrhea. Get help right away if you have:  A racing heart and shortness of breath.  Thoughts of hurting yourself or others. If you ever feel like you may hurt yourself or others, or have thoughts about taking your own life, get help right away. You can go to your nearest emergency department or call:  Your  local emergency services (911 in the [place]  A suicide crisis helpline, such as the National Suicide Prevention Lifeline at 438-848-5040. This is open 24 hours a day. Summary  Taking steps to learn and use tension reduction techniques can help calm you and help prevent triggering an anxiety reaction.  When used together, medicines, psychotherapy, and tension reduction techniques may be the most effective treatment.  Family, friends, and partners can play a big part in helping you recover from an anxiety disorder. This  information is not intended to replace advice given to you by your health care provider. Make sure you discuss any questions you have with your health care provider. Document Revised: 08/25/2018 Document Reviewed: 08/25/2018 Elsevier Patient Education  [employer].   If you have lab work done today you will be contacted with your lab results within the next 2 weeks.  If you have not heard from [place] then please contact us. The fastest way to get your results is to register for My Chart.   IF you received an x-ray today, you will receive an invoice from [facility]. Please contact [facility] at [phone] with questions or concerns regarding your invoice.   IF you received labwork today, you will receive an invoice from [facility]. Please contact [facility] at [phone] with questions or concerns regarding your invoice.   Our billing staff will not be able to assist you with questions regarding bills from these companies.  You will be contacted with the lab results as soon as they are available. The fastest way to get your results is to activate your My Chart account. Instructions are located on the last page of this paperwork. If you have not heard from [place] regarding the results in 2 weeks, please contact this office.

## 2019-11-04 NOTE — Progress Notes (Signed)
Subjective:  Patient ID: Angel Baxter, male    DOB: 01/01/1986  Age: 34 y.o. MRN: [id number]  CC:  Chief Complaint  Patient presents with  . Annual Exam    HPI Angel Baxter presents for   Anxiety. Not here for physical.   Anxiety History of anxiety, increased during pandemic, discussed in November of last year with palpitations at that time. Some insomnia as well. Handout given on both, no initial meds started with rtc precautions.  telemed visit in 03/2019 - worse anxiety. Started on lexapro for 1 month. Noted more palpitations/PVC's. Anxiety was improved though.  Stop meds, but PVC's took about 6 months to decrease to stable level.  Prior pvc's better with MVI qd.  Noted in his reading that lexapro may cause pvc's.  No other SSRi prior. Friends on zoloft.  Only occasional insomnia. No melatonin - too groggy next day.  No counseling - time prohibitive.  Work ok. No changes. 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  QTc normal 392 in 01/2019.    L knee pain: Sore at top of kneecap past 4-5 months, ?growth in area.  Not limiting activity No locking/giving way.  No swelling.  No known injury.   History There are no problems to display for this patient.  Past Medical History:  Diagnosis Date  . Anxiety    Past Surgical History:  Procedure Laterality Date  . TONSILECTOMY, ADENOIDECTOMY, BILATERAL MYRINGOTOMY AND TUBES     at age 35    No Known Allergies Prior to Admission medications   Medication Sig Start Date End Date Taking? Authorizing Provider  Multiple Vitamin (MULTIVITAMIN) tablet Take 1 tablet by mouth daily.   Yes [provider]   Social History   Socioeconomic History  . Marital status: Significant Other    Spouse name: Not on file  . Number of children: Not on file  . Years of education: Not on file  . Highest education level: Not on file  Occupational History  . Not on file  Tobacco Use  . Smoking status: Never Smoker    . Smokeless tobacco: Never Used  Substance and Sexual Activity  . Alcohol use: Never    Alcohol/week: 0.0 standard drinks  . Drug use: Never  . Sexual activity: Not on file  Other Topics Concern  . Not on file  Social History Narrative  . Not on file   Social Determinants of Health   Financial Resource Strain:   . Difficulty of Paying Living Expenses:   Food Insecurity:   . Worried About Programme researcher, broadcasting/film/video in the Last Year:   . Barista in the Last Year:   Transportation Needs:   . Freight forwarder (Medical):   Marland Kitchen Lack of Transportation (Non-Medical):   Physical Activity:   . Days of Exercise per Week:   . Minutes of Exercise per Session:   Stress:   . Feeling of Stress :   Social Connections:   . Frequency of Communication with Friends and Family:   . Frequency of Social Gatherings with Friends and Family:   . Attends Religious Services:   . Active Member of Clubs or Organizations:   . Attends Banker Meetings:   Marland Kitchen Marital Status:   Intimate Partner Violence:   . Fear of Current or Ex-Partner:   . Emotionally Abused:   Marland Kitchen Physically Abused:   . Sexually Abused:     Review of Systems  Objective:   Vitals:   11/04/19 1356  BP: 124/74  Pulse: 73  Temp: 97.9 F (36.6 C)  SpO2: 96%  Weight: (!) 206 lb (93.4 kg)  Height: 6' (1.829 m)     Physical Exam Constitutional:      General: He is not in acute distress.    Appearance: He is well-developed.  HENT:     Head: Normocephalic and atraumatic.  Cardiovascular:     Rate and Rhythm: Normal rate.  Pulmonary:     Effort: Pulmonary effort is normal.  Musculoskeletal:     Left knee: Crepitus (min at patella. ) present. No deformity, effusion or bony tenderness. Normal range of motion. No tenderness. No LCL laxity, MCL laxity, ACL laxity or PCL laxity.Normal alignment, normal meniscus and normal patellar mobility.     Instability Tests: Anterior drawer test negative. Posterior drawer  test negative. Anterior Lachman test negative. Medial McMurray test negative and lateral McMurray test negative.  Neurological:     Mental Status: He is alert and oriented to person, place, and time.  Psychiatric:        Mood and Affect: Mood normal.        Behavior: Behavior normal.        Thought Content: Thought content normal.     DG Knee Complete 4 Views Left  Result Date: 11/04/2019 CLINICAL DATA:  34 year old male with left knee pain. No known injury. EXAM: LEFT KNEE - COMPLETE 4+ VIEW COMPARISON:  None. FINDINGS: No evidence of fracture, dislocation, or joint effusion. No evidence of arthropathy or other focal bone abnormality. Soft tissues are unremarkable. IMPRESSION: Negative. Electronically Signed   By: Elgie Collard M.D.   On: 11/04/2019 15:09      Assessment & Plan:  Angel Baxter is a 34 y.o. male . Palpitations - Plan: busPIRone (BUSPAR) 5 MG tablet, TSH Anxiety state - Plan: busPIRone (BUSPAR) 5 MG tablet  - trial of low dose buspar as prior increased PVC with SSRi. Could consider low dose zoloft, but would watch for QT prolongation. EKG if palpitations return. Follow up 6 weeks.  Left knee pain, unspecified chronicity - Plan: DG Knee Complete 4 Views Left  - possible insertional tendonitis. Reassuring XR. RTC precautions.    Meds ordered this encounter  Medications  . busPIRone (BUSPAR) 5 MG tablet    Sig: Take 1 tablet (5 mg total) by mouth 2 (two) times daily.    Dispense:  60 tablet    Refill:  1   Patient Instructions    Try buspar initially once per day for a few days then increase to twice per day.  Let me know how that is doing the next few weeks.  6-week follow-up via virtual visit is fine unless she would continue to have knee symptoms, been scheduled as an office.  I will let you know if there are concerns on your knee x-ray, but some of that pain may be due to the tendon from the muscle of the quadriceps.  Range of motion, stretches, topical ice  over that area as needed short-term.  Follow-up if symptoms are worsening.  Thanks for coming in today and let me know if there are other questions.   Managing Anxiety, Adult After being diagnosed with an anxiety disorder, you may be relieved to know why you have felt or behaved a certain way. You may also feel overwhelmed about the treatment ahead and what it will mean for your life. With care and support, you can  manage this condition and recover from it. How to manage lifestyle changes Managing stress and anxiety  Stress is your body's reaction to life changes and events, both good and bad. Most stress will last just a few hours, but stress can be ongoing and can lead to more than just stress. Although stress can play a major role in anxiety, it is not the same as anxiety. Stress is usually caused by something external, such as a deadline, test, or competition. Stress normally passes after the triggering event has ended.  Anxiety is caused by something internal, such as imagining a terrible outcome or worrying that something will go wrong that will devastate you. Anxiety often does not go away even after the triggering event is over, and it can become long-term (chronic) worry. It is important to understand the differences between stress and anxiety and to manage your stress effectively so that it does not lead to an anxious response. Talk with your health care provider or a counselor to learn more about reducing anxiety and stress. He or she may suggest tension reduction techniques, such as:  Music therapy. This can include creating or listening to music that you enjoy and that inspires you.  Mindfulness-based meditation. This involves being aware of your normal breaths while not trying to control your breathing. It can be done while sitting or walking.  Centering prayer. This involves focusing on a word, phrase, or sacred image that means something to you and brings you peace.  Deep  breathing. To do this, expand your stomach and inhale slowly through your nose. Hold your breath for 3-5 seconds. Then exhale slowly, letting your stomach muscles relax.  Self-talk. This involves identifying thought patterns that lead to anxiety reactions and changing those patterns.  Muscle relaxation. This involves tensing muscles and then relaxing them. Choose a tension reduction technique that suits your lifestyle and personality. These techniques take time and practice. Set aside 5-15 minutes a day to do them. Therapists can offer counseling and training in these techniques. The training to help with anxiety may be covered by some insurance plans. Other things you can do to manage stress and anxiety include:  Keeping a stress/anxiety diary. This can help you learn what triggers your reaction and then learn ways to manage your response.  Thinking about how you react to certain situations. You may not be able to control everything, but you can control your response.  Making time for activities that help you relax and not feeling guilty about spending your time in this way.  Visual imagery and yoga can help you stay calm and relax.  Medicines Medicines can help ease symptoms. Medicines for anxiety include:  Anti-anxiety drugs.  Antidepressants. Medicines are often used as a primary treatment for anxiety disorder. Medicines will be prescribed by a health care provider. When used together, medicines, psychotherapy, and tension reduction techniques may be the most effective treatment. Relationships Relationships can play a big part in helping you recover. Try to spend more time connecting with trusted friends and family members. Consider going to couples counseling, taking family education classes, or going to family therapy. Therapy can help you and others better understand your condition. How to recognize changes in your anxiety Everyone responds differently to treatment for anxiety.  Recovery from anxiety happens when symptoms decrease and stop interfering with your daily activities at home or work. This may mean that you will start to:  Have better concentration and focus. Worry will interfere less in your daily  thinking.  Sleep better.  Be less irritable.  Have more energy.  Have improved memory. It is important to recognize when your condition is getting worse. Contact your health care provider if your symptoms interfere with home or work and you feel like your condition is not improving. Follow these instructions at home: Activity  Exercise. Most adults should do the following: ? Exercise for at least 150 minutes each week. The exercise should increase your heart rate and make you sweat (moderate-intensity exercise). ? Strengthening exercises at least twice a week.  Get the right amount and quality of sleep. Most adults need 7-9 hours of sleep each night. Lifestyle   Eat a healthy diet that includes plenty of vegetables, fruits, whole grains, low-fat dairy products, and lean protein. Do not eat a lot of foods that are high in solid fats, added sugars, or salt.  Make choices that simplify your life.  Do not use any products that contain nicotine or tobacco, such as cigarettes, e-cigarettes, and chewing tobacco. If you need help quitting, ask your health care provider.  Avoid caffeine, alcohol, and certain over-the-counter cold medicines. These may make you feel worse. Ask your pharmacist which medicines to avoid. General instructions  Take over-the-counter and prescription medicines only as told by your health care provider.  Keep all follow-up visits as told by your health care provider. This is important. Where to find support You can get help and support from these sources:  Self-help groups.  Online and [employer].  A trusted spiritual leader.  Couples counseling.  Family education classes.  Family therapy. Where to find more  information You may find that joining a support group helps you deal with your anxiety. The following sources can help you locate counselors or support groups near you:  Mental Health America: www.mentalhealthamerica.net  Anxiety and Depression Association of [place] (ADAA): ProgramCam.de  The [employer] on Mental Illness (NAMI): www.nami.org Contact a health care provider if you:  Have a hard time staying focused or finishing daily tasks.  Spend many hours a day feeling worried about everyday life.  Become exhausted by worry.  Start to have headaches, feel tense, or have nausea.  Urinate more than normal.  Have diarrhea. Get help right away if you have:  A racing heart and shortness of breath.  Thoughts of hurting yourself or others. If you ever feel like you may hurt yourself or others, or have thoughts about taking your own life, get help right away. You can go to your nearest emergency department or call:  Your local emergency services (911 in the [place]  A suicide crisis helpline, such as the National Suicide Prevention Lifeline at 475-751-3171. This is open 24 hours a day. Summary  Taking steps to learn and use tension reduction techniques can help calm you and help prevent triggering an anxiety reaction.  When used together, medicines, psychotherapy, and tension reduction techniques may be the most effective treatment.  Family, friends, and partners can play a big part in helping you recover from an anxiety disorder. This information is not intended to replace advice given to you by your health care provider. Make sure you discuss any questions you have with your health care provider. Document Revised: 08/25/2018 Document Reviewed: 08/25/2018 Elsevier Patient Education  [employer].   If you have lab work done today you will be contacted with your lab results within the next 2 weeks.  If you have not heard from [place] then please contact us. The fastest way  to  get your results is to register for My Chart.   IF you received an x-ray today, you will receive an invoice from [facility]. Please contact [facility] at [phone] with questions or concerns regarding your invoice.   IF you received labwork today, you will receive an invoice from [facility]. Please contact [facility] at [phone] with questions or concerns regarding your invoice.   Our billing staff will not be able to assist you with questions regarding bills from these companies.  You will be contacted with the lab results as soon as they are available. The fastest way to get your results is to activate your My Chart account. Instructions are located on the last page of this paperwork. If you have not heard from us regarding the results in 2 weeks, please contact this office.          Signed, Meredith StaggersJeffrey Yojan Paskett, MD [facility]

## 2019-11-05 LAB — TSH: TSH: 3.98 u[IU]/mL (ref 0.450–4.500)

## 2019-12-27 ENCOUNTER — Encounter: Payer: Self-pay | Admitting: Family Medicine

## 2019-12-27 ENCOUNTER — Other Ambulatory Visit: Payer: Self-pay

## 2019-12-27 ENCOUNTER — Telehealth (INDEPENDENT_AMBULATORY_CARE_PROVIDER_SITE_OTHER): Payer: 59 | Admitting: Family Medicine

## 2019-12-27 DIAGNOSIS — R002 Palpitations: Secondary | ICD-10-CM

## 2019-12-27 DIAGNOSIS — F411 Generalized anxiety disorder: Secondary | ICD-10-CM | POA: Diagnosis not present

## 2019-12-27 DIAGNOSIS — R69 Illness, unspecified: Secondary | ICD-10-CM | POA: Diagnosis not present

## 2019-12-27 MED ORDER — BUSPIRONE HCL 7.5 MG PO TABS: 7.5000 mg | ORAL_TABLET | Freq: Two times a day (BID) | ORAL | 3 refills | Status: DC

## 2019-12-27 NOTE — Patient Instructions (Signed)
° ° ° °  If you have lab work done today you will be contacted with your lab results within the next 2 weeks.  If you have not heard from us then please contact us. The fastest way to get your results is to register for My Chart. ° ° °IF you received an x-ray today, you will receive an invoice from [facility]. Please contact [facility] at [phone] with questions or concerns regarding your invoice.  ° °IF you received labwork today, you will receive an invoice from [facility]. Please contact [facility] at [phone] with questions or concerns regarding your invoice.  ° °Our billing staff will not be able to assist you with questions regarding bills from these companies. ° °You will be contacted with the lab results as soon as they are available. The fastest way to get your results is to activate your My Chart account. Instructions are located on the last page of this paperwork. If you have not heard from us regarding the results in 2 weeks, please contact this office. °  ° ° ° °
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
## 2020-03-22 DIAGNOSIS — R69 Illness, unspecified: Secondary | ICD-10-CM | POA: Diagnosis not present

## 2020-03-29 DIAGNOSIS — R69 Illness, unspecified: Secondary | ICD-10-CM | POA: Diagnosis not present

## 2020-04-11 DIAGNOSIS — R69 Illness, unspecified: Secondary | ICD-10-CM | POA: Diagnosis not present

## 2020-04-19 DIAGNOSIS — R69 Illness, unspecified: Secondary | ICD-10-CM | POA: Diagnosis not present

## 2020-04-21 ENCOUNTER — Other Ambulatory Visit: Payer: Self-pay | Admitting: Family Medicine

## 2020-04-21 DIAGNOSIS — F411 Generalized anxiety disorder: Secondary | ICD-10-CM

## 2020-04-21 DIAGNOSIS — R002 Palpitations: Secondary | ICD-10-CM

## 2020-05-03 DIAGNOSIS — R69 Illness, unspecified: Secondary | ICD-10-CM | POA: Diagnosis not present

## 2020-05-09 DIAGNOSIS — R69 Illness, unspecified: Secondary | ICD-10-CM | POA: Diagnosis not present

## 2020-05-15 DIAGNOSIS — Z03818 Encounter for observation for suspected exposure to other biological agents ruled out: Secondary | ICD-10-CM | POA: Diagnosis not present

## 2020-05-15 DIAGNOSIS — Z20822 Contact with and (suspected) exposure to covid-19: Secondary | ICD-10-CM | POA: Diagnosis not present

## 2020-05-18 DIAGNOSIS — Z20822 Contact with and (suspected) exposure to covid-19: Secondary | ICD-10-CM | POA: Diagnosis not present

## 2020-06-05 DIAGNOSIS — R69 Illness, unspecified: Secondary | ICD-10-CM | POA: Diagnosis not present

## 2020-06-16 DIAGNOSIS — R69 Illness, unspecified: Secondary | ICD-10-CM | POA: Diagnosis not present

## 2020-06-21 DIAGNOSIS — R69 Illness, unspecified: Secondary | ICD-10-CM | POA: Diagnosis not present

## 2020-06-27 DIAGNOSIS — R69 Illness, unspecified: Secondary | ICD-10-CM | POA: Diagnosis not present

## 2020-07-03 DIAGNOSIS — R69 Illness, unspecified: Secondary | ICD-10-CM | POA: Diagnosis not present

## 2020-07-05 DIAGNOSIS — R69 Illness, unspecified: Secondary | ICD-10-CM | POA: Diagnosis not present

## 2020-07-12 DIAGNOSIS — R69 Illness, unspecified: Secondary | ICD-10-CM | POA: Diagnosis not present

## 2020-07-21 DIAGNOSIS — R69 Illness, unspecified: Secondary | ICD-10-CM | POA: Diagnosis not present

## 2020-08-09 DIAGNOSIS — R69 Illness, unspecified: Secondary | ICD-10-CM | POA: Diagnosis not present

## 2020-08-22 DIAGNOSIS — R69 Illness, unspecified: Secondary | ICD-10-CM | POA: Diagnosis not present

## 2020-08-30 DIAGNOSIS — R69 Illness, unspecified: Secondary | ICD-10-CM | POA: Diagnosis not present

## 2020-08-31 DIAGNOSIS — R69 Illness, unspecified: Secondary | ICD-10-CM | POA: Diagnosis not present

## 2020-09-04 ENCOUNTER — Other Ambulatory Visit: Payer: Self-pay | Admitting: Family Medicine

## 2020-09-04 DIAGNOSIS — F411 Generalized anxiety disorder: Secondary | ICD-10-CM

## 2020-09-04 DIAGNOSIS — R002 Palpitations: Secondary | ICD-10-CM

## 2020-09-06 DIAGNOSIS — R69 Illness, unspecified: Secondary | ICD-10-CM | POA: Diagnosis not present

## 2020-10-04 ENCOUNTER — Other Ambulatory Visit: Payer: Self-pay | Admitting: Family Medicine

## 2020-10-04 DIAGNOSIS — R002 Palpitations: Secondary | ICD-10-CM

## 2020-10-04 DIAGNOSIS — F411 Generalized anxiety disorder: Secondary | ICD-10-CM

## 2020-10-04 NOTE — Telephone Encounter (Signed)
Pt has an appt on 10/06/20 [facility]

## 2020-10-06 ENCOUNTER — Telehealth (INDEPENDENT_AMBULATORY_CARE_PROVIDER_SITE_OTHER): Payer: 59 | Admitting: Family Medicine

## 2020-10-06 ENCOUNTER — Other Ambulatory Visit: Payer: Self-pay

## 2020-10-06 DIAGNOSIS — R002 Palpitations: Secondary | ICD-10-CM | POA: Diagnosis not present

## 2020-10-06 DIAGNOSIS — F411 Generalized anxiety disorder: Secondary | ICD-10-CM | POA: Diagnosis not present

## 2020-10-06 DIAGNOSIS — U071 COVID-19: Secondary | ICD-10-CM

## 2020-10-06 DIAGNOSIS — R69 Illness, unspecified: Secondary | ICD-10-CM | POA: Diagnosis not present

## 2020-10-06 MED ORDER — BUSPIRONE HCL 7.5 MG PO TABS: ORAL_TABLET | ORAL | 2 refills | Status: DC

## 2020-10-06 MED ORDER — NIRMATRELVIR/RITONAVIR (PAXLOVID)TABLET: 3.0000 | ORAL_TABLET | Freq: Two times a day (BID) | ORAL | 0 refills | Status: AC

## 2020-10-06 NOTE — Patient Instructions (Addendum)
Paxlovid sent to your pharmacy.  Tylenol, Mucinex, fluids and rest for treatment of COVID-19 infection.  If increasing confusion, shortness of breath, or acute worsening symptoms be seen through urgent care or the emergency room.  Hope you all start to improve soon.  Let me know if you have questions.  Continue same dose of buspirone for now.  Follow-up in 6 months but let me know if there are questions sooner.  Your employer may have specific requirements for return to work, but this information is provided from the CDC. There is a link provided below for more information if needed.   Everyone who has presumed or confirmed COVID-19 should stay home and isolate from other people for at least 5 full days (day 0 is the first day of symptoms or the date of the day of the positive viral test for asymptomatic persons). You can end isolation after 5 full days if you are fever-free for 24 hours without the use of fever-reducing medication and your other symptoms have improved (Loss of taste and smell may persist for weeks or months after recovery and need not delay the end of isolation). You should continue to wear a well-fitting mask around others at home and in public for 5 additional days (day 6 through day 10) after the end of your 5-day isolation period. If you are unable to wear a mask when around others, you should continue to isolate for a full 10 days. Avoid people who have weakened immune systems or are more likely to get very sick from COVID-19, and nursing homes and other high-risk settings, until after at least 10 days.  If you continue to have fever or your other symptoms have not improved after 5 days of isolation, you should wait to end your isolation until you are fever-free for 24 hours without the use of fever-reducing medication and your other symptoms have improved. Continue to wear a well-fitting mask through day 10.    [email or web address]    If you have lab work done today you will be contacted with your lab results within the next 2 weeks.  If you have not heard from [place] then please contact us. The fastest way to get your results is to register for My Chart.   IF you received an x-ray today, you will receive an invoice from [facility]. Please contact [facility] at [phone] with questions or concerns regarding your invoice.   IF you received labwork today, you will receive an invoice from [place]. Please contact [facility] at [phone] with questions or concerns regarding your invoice.   Our billing staff will not be able to assist you with questions regarding bills from these companies.  You will be contacted with the lab results as soon as they are available. The fastest way to get your results is to activate your My Chart account. Instructions are located on the last page of this paperwork. If you have not heard from [place] regarding the results in 2 weeks, please contact this office.

## 2020-10-06 NOTE — Progress Notes (Signed)
Virtual Visit via Video Note  I connected with Angel Baxter on 10/06/20 at 10:54 AM by a video enabled telemedicine application and verified that I am speaking with the correct person using two identifiers.  Patient location:home My location: office - [place].    I discussed the limitations, risks, security and privacy concerns of performing an evaluation and management service by telephone and the availability of in person appointments. I also discussed with the patient that there may be a patient responsible charge related to this service. The patient expressed understanding and agreed to proceed, consent obtained  Chief complaint:  Chief Complaint  Patient presents with   Medication Refill    Patient states he would like to discuss needs a medication refill for Buspar. He also tested positive for covid 4 days ago and has been experiencing some cough, congestion ,fatigue and dizziness. He has been currently taking over the counter medications for these symptoms.,     History of Present Illness: Angel Baxter is a 35 y.o. male  COVID-19 infection Tested positive 6/28 after son was positive. Sx's started later that day -  Symptoms started 4 days ago.  Cough, congestion, fatigue, intermittent dizziness.  Treated with over-the-counter medications - advil.  Dyspnea with activity, normal O2 sat -98. Min dyspnea at rest.  Drinking fluids. Staying hydrated. Out of it feeling at times. Feeling foggy.  Discussed antivirals.   6yo son sick with covid few days prior.   COVID immunization: had covid vaccine and 1st booster.   Anxiety with history of PVC, palpitations. Last discussed in September 2021.  Treated with BuSpar with some improvement, dosage increased to 7.5 mg twice daily at that visit.  Option of 10 mg twice daily if needed.  Also discussed if increasing PVCs needed EKG to evaluate QT interval. Working well on 7.5mg  BID.  Off for few months. Would like to restart.  No  SI/HI.  Rare palpitations.  Separated from GF 6 months ago. Less anxious.      There are no problems to display for this patient.  Past Medical History:  Diagnosis Date   Anxiety    Past Surgical History:  Procedure Laterality Date   TONSILECTOMY, ADENOIDECTOMY, BILATERAL MYRINGOTOMY AND TUBES     at age 54    No Known Allergies Prior to Admission medications   Medication Sig Start Date End Date Taking? Authorizing Provider  busPIRone (BUSPAR) 7.5 MG tablet TAKE ONE TABLET BY MOUTH TWICE A DAY **MUST CALL MD FOR APPOINTMENT 10/04/20  Yes Shade Flood, MD  Multiple Vitamin (MULTIVITAMIN) tablet Take 1 tablet by mouth daily.   Yes [provider]   Social History   Socioeconomic History   Marital status: Significant Other    Spouse name: Not on file   Number of children: Not on file   Years of education: Not on file   Highest education level: Not on file  Occupational History   Not on file  Tobacco Use   Smoking status: Never   Smokeless tobacco: Never  Substance and Sexual Activity   Alcohol use: Never    Alcohol/week: 0.0 standard drinks   Drug use: Never   Sexual activity: Not on file  Other Topics Concern   Not on file  Social History Narrative   Not on file   Social Determinants of Health   Financial Resource Strain: Not on file  Food Insecurity: Not on file  Transportation Needs: Not on file  Physical Activity: Not on file  Stress: Not on file  Social Connections: Not on file  Intimate Partner Violence: Not on file    Observations/Objective: There were no vitals filed for this visit. Nontoxic appearance on video, speaking in full sentences, appropriate/coherent responses.  No respiratory distress.  Euthymic mood.  Denies SI/HI.  All questions were answered with understanding of plan expressed  Assessment and Plan: Anxiety state - Plan: busPIRone (BUSPAR) 7.5 MG tablet  Palpitations - Plan: busPIRone (BUSPAR) 7.5 MG tablet  COVID-19  virus infection - Plan: nirmatrelvir/ritonavir EUA (PAXLOVID) TABS  Will continue outpatient treatment for COVID-19 at this time, with antiviral discussed, prescribed as above including side effects.  Symptomatic care discussed with ER precautions.  Anxiety/palpitations improved, partially due to change in home status, relationship.  We will continue buspirone with recheck in 6 months.  RTC precautions.  Follow Up Instructions: 6 months, ER precautions for COVID.   I discussed the assessment and treatment plan with the patient. The patient was provided an opportunity to ask questions and all were answered. The patient agreed with the plan and demonstrated an understanding of the instructions.   The patient was advised to call back or seek an in-person evaluation if the symptoms worsen or if the condition fails to improve as anticipated.  I provided 25 minutes of non-face-to-face time during this encounter.   Shade Flood, MD

## 2020-10-27 DIAGNOSIS — R69 Illness, unspecified: Secondary | ICD-10-CM | POA: Diagnosis not present

## 2020-10-31 DIAGNOSIS — R69 Illness, unspecified: Secondary | ICD-10-CM | POA: Diagnosis not present

## 2020-11-13 DIAGNOSIS — R69 Illness, unspecified: Secondary | ICD-10-CM | POA: Diagnosis not present

## 2020-11-24 DIAGNOSIS — R69 Illness, unspecified: Secondary | ICD-10-CM | POA: Diagnosis not present

## 2020-11-30 DIAGNOSIS — R69 Illness, unspecified: Secondary | ICD-10-CM | POA: Diagnosis not present

## 2020-12-14 DIAGNOSIS — R69 Illness, unspecified: Secondary | ICD-10-CM | POA: Diagnosis not present

## 2020-12-22 DIAGNOSIS — R69 Illness, unspecified: Secondary | ICD-10-CM | POA: Diagnosis not present

## 2020-12-28 DIAGNOSIS — R69 Illness, unspecified: Secondary | ICD-10-CM | POA: Diagnosis not present

## 2021-01-04 DIAGNOSIS — R69 Illness, unspecified: Secondary | ICD-10-CM | POA: Diagnosis not present

## 2021-01-12 DIAGNOSIS — R69 Illness, unspecified: Secondary | ICD-10-CM | POA: Diagnosis not present

## 2021-01-19 DIAGNOSIS — R69 Illness, unspecified: Secondary | ICD-10-CM | POA: Diagnosis not present

## 2021-01-25 DIAGNOSIS — R69 Illness, unspecified: Secondary | ICD-10-CM | POA: Diagnosis not present

## 2021-02-01 DIAGNOSIS — R69 Illness, unspecified: Secondary | ICD-10-CM | POA: Diagnosis not present

## 2021-02-09 DIAGNOSIS — R69 Illness, unspecified: Secondary | ICD-10-CM | POA: Diagnosis not present

## 2021-02-22 DIAGNOSIS — R69 Illness, unspecified: Secondary | ICD-10-CM | POA: Diagnosis not present

## 2021-03-09 DIAGNOSIS — R69 Illness, unspecified: Secondary | ICD-10-CM | POA: Diagnosis not present

## 2021-03-16 DIAGNOSIS — R69 Illness, unspecified: Secondary | ICD-10-CM | POA: Diagnosis not present

## 2021-03-22 DIAGNOSIS — R69 Illness, unspecified: Secondary | ICD-10-CM | POA: Diagnosis not present

## 2021-04-23 ENCOUNTER — Telehealth (INDEPENDENT_AMBULATORY_CARE_PROVIDER_SITE_OTHER): Payer: 59 | Admitting: Family Medicine

## 2021-04-23 ENCOUNTER — Encounter: Payer: Self-pay | Admitting: Family Medicine

## 2021-04-23 VITALS — BP 132/86

## 2021-04-23 DIAGNOSIS — R69 Illness, unspecified: Secondary | ICD-10-CM | POA: Diagnosis not present

## 2021-04-23 DIAGNOSIS — F411 Generalized anxiety disorder: Secondary | ICD-10-CM

## 2021-04-23 DIAGNOSIS — R002 Palpitations: Secondary | ICD-10-CM | POA: Diagnosis not present

## 2021-04-23 MED ORDER — BUSPIRONE HCL 10 MG PO TABS: 10.0000 mg | ORAL_TABLET | Freq: Two times a day (BID) | ORAL | 2 refills | Status: DC

## 2021-04-23 NOTE — Progress Notes (Signed)
Virtual Visit via Video Note  I connected with Angel Baxter on 04/23/21 at 4:51 PM by a video enabled telemedicine application and verified that I am speaking with the correct person using two identifiers.  Patient location: home - by self. My location: office [place].    I discussed the limitations, risks, security and privacy concerns of performing an evaluation and management service by telephone and the availability of in person appointments. I also discussed with the patient that there may be a patient responsible charge related to this service. The patient expressed understanding and agreed to proceed, consent obtained  Chief complaint:  Chief Complaint  Patient presents with   Anxiety    Pt requesting increase in buspar reports about 6 months on current dose and could be more effective      History of Present Illness: Angel Baxter is a 36 y.o. male  Anxiety with history of PVCs, palpitations. Last discussed July 2022.  BuSpar had improved symptoms with dosage increased to 7.5 mg twice daily.  Overall working well at that dosing but had been off for a few months.  Plan to restart.  Less anxiety after separation from GF 6 months prior.  Doing well, but feeling more anxious - last few months. Long year but no specific stressors. Back and forth with lawyers, 6yo son doing well. Would like to try higher dose.  No regular palpitatons. No change in frequency.   Depression screen [facility] 2/9 12/27/2019 11/04/2019 02/26/2019 01/13/2019  Decreased Interest 0 0 0 0  Down, Depressed, Hopeless 0 0 0 0  PHQ - 2 Score 0 0 0 0   GAD 7 : Generalized Anxiety Score 04/23/2021 02/26/2019  Nervous, Anxious, on Edge 2 2  Control/stop worrying 1 1  Worry too much - different things 2 2  Trouble relaxing 3 3  Restless 0 1  Easily annoyed or irritable 0 1  Afraid - awful might happen 1 1  Total GAD 7 Score 9 11     There are no problems to display for this patient.  Past Medical History:   Diagnosis Date   Anxiety    Past Surgical History:  Procedure Laterality Date   TONSILECTOMY, ADENOIDECTOMY, BILATERAL MYRINGOTOMY AND TUBES     at age 51    No Known Allergies Prior to Admission medications   Medication Sig Start Date End Date Taking? Authorizing Provider  busPIRone (BUSPAR) 7.5 MG tablet TAKE ONE TABLET BY MOUTH TWICE A DAY 10/06/20  Yes Shade Flood, MD  Multiple Vitamin (MULTIVITAMIN) tablet Take 1 tablet by mouth daily.   Yes [provider]   Social History   Socioeconomic History   Marital status: Significant Other    Spouse name: Not on file   Number of children: Not on file   Years of education: Not on file   Highest education level: Not on file  Occupational History   Not on file  Tobacco Use   Smoking status: Never   Smokeless tobacco: Never  Substance and Sexual Activity   Alcohol use: Never    Alcohol/week: 0.0 standard drinks   Drug use: Never   Sexual activity: Not on file  Other Topics Concern   Not on file  Social History Narrative   Not on file   Social Determinants of Health   Financial Resource Strain: Not on file  Food Insecurity: Not on file  Transportation Needs: Not on file  Physical Activity: Not on file  Stress: Not on  file  Social Connections: Not on file  Intimate Partner Violence: Not on file    Observations/Objective: Vitals:   04/23/21 1618  BP: 132/86  No distress on video, euthymic mood.  Speaking full sentences without distress.  Denies SI/HI.  Does not appear to be responding to internal stimuli.  Euthymic mood.   Assessment and Plan: Anxiety state - Plan: busPIRone (BUSPAR) 10 MG tablet  Palpitations Decrease anxiety control as above.  We will try higher dose of BuSpar.  Potential side effects discussed.  RTC precautions if increased palpitations.  In office visit planned in 6 months.  Follow Up Instructions: As above.    I discussed the assessment and treatment plan with the patient. The  patient was provided an opportunity to ask questions and all were answered. The patient agreed with the plan and demonstrated an understanding of the instructions.   The patient was advised to call back or seek an in-person evaluation if the symptoms worsen or if the condition fails to improve as anticipated.  Shade Flood, MD

## 2021-04-27 DIAGNOSIS — R69 Illness, unspecified: Secondary | ICD-10-CM | POA: Diagnosis not present

## 2021-05-04 DIAGNOSIS — R69 Illness, unspecified: Secondary | ICD-10-CM | POA: Diagnosis not present

## 2021-06-14 ENCOUNTER — Encounter: Payer: Self-pay | Admitting: Family Medicine

## 2021-06-14 ENCOUNTER — Ambulatory Visit: Payer: 59 | Admitting: Family Medicine

## 2021-06-14 VITALS — BP 126/70 | HR 70 | Temp 97.6°F | Resp 15 | Ht 72.0 in | Wt 199.0 lb

## 2021-06-14 DIAGNOSIS — N529 Male erectile dysfunction, unspecified: Secondary | ICD-10-CM

## 2021-06-14 DIAGNOSIS — Z1159 Encounter for screening for other viral diseases: Secondary | ICD-10-CM | POA: Diagnosis not present

## 2021-06-14 DIAGNOSIS — F411 Generalized anxiety disorder: Secondary | ICD-10-CM

## 2021-06-14 DIAGNOSIS — R69 Illness, unspecified: Secondary | ICD-10-CM | POA: Diagnosis not present

## 2021-06-14 LAB — TESTOSTERONE: Testosterone: 279.35 ng/dL — ABNORMAL LOW (ref 300.00–890.00)

## 2021-06-14 MED ORDER — BUSPIRONE HCL 7.5 MG PO TABS: 7.5000 mg | ORAL_TABLET | Freq: Two times a day (BID) | ORAL | 3 refills | Status: DC

## 2021-06-14 NOTE — Progress Notes (Signed)
? ?Subjective:  ?Patient ID: Angel Baxter, male    DOB: December 25, 1985  Age: 36 y.o. MRN: [id number] ? ?CC:  ?Chief Complaint  ?Patient presents with  ? Erectile Dysfunction  ?  Pt reports 6 weeks ago abruptly started having issues with ED denied changes in diet or medications unsure what may have caused this   ? Depression  ?  PHQ9 score =9 pt notes has not been taking buspar as it caused him to feel funny   ? ? ?HPI ?Angel Baxter presents for  ? ?Erectile dysfunction ?Noticed 6 weeks ago. Sudden. Does not have morning erections. Able to have erection if stimulation, but less tumescence.  ?Current relationship 1.5 yrs. No stressors, relationship going well.  ?Prior preworkout supplement. Now on Beet root, red vegetable only.   ?Prior pre-testosterone supplement with workouts - stopped last year. No DHEA.  ?Had independent lab work 1 month ago - [employer] [facility]. Morning labs, fasting.  Was prescribed testosterone based on symptoms, but he did not fill.  ?Labs reviewed from his phone - drawn on 04/18/2021.  ALT 28, albumin 5.1, AST of 19, estradiol less than 15, free testosterone 11 -percent 2.31, LH 2.04, SHBG 21.6, total testosterone 491. ?No back pain or recent issue.  ?No new groin pain, no cycling.  ?No testicular pain, mass, swelling, or new genital rash/lesions.  No incontinence. ? ?Depression/anxiety: ?Last discussed in January. ?BuSpar had previously improved symptoms with dosage increased to 7.5 mg twice daily.  Did have less anxiety after separation from girlfriend 6 months prior.  Had noticed some return of anxiety previous few months and had been back on meds.  Recommended trial of BuSpar 10 mg twice daily. ? ?Notes some side effects at the higher dose. Felt like blood pressure elevated heartbeat feeling in gums, felt confused, noticed 20 mins after dose, then resolved after 1 hour.  Stopped taking BuSpar.  Felt fine on 7.5mg  dosing.  ?Denies feeling anxiety off meds. Still in therapy - able to use coping  techniques from therapy.  Considering keeping meds on hand to restart if anxiety returns. No palpitations off Buspar.  ? ?Depression screen [facility] 2/9 06/14/2021 12/27/2019 11/04/2019 02/26/2019 01/13/2019  ?Decreased Interest 1 0 0 0 0  ?Down, Depressed, Hopeless 0 0 0 0 0  ?PHQ - 2 Score 1 0 0 0 0  ?Altered sleeping 2 - - - -  ?Tired, decreased energy 3 - - - -  ?Change in appetite 1 - - - -  ?Feeling bad or failure about yourself  1 - - - -  ?Trouble concentrating 1 - - - -  ?Moving slowly or fidgety/restless 0 - - - -  ?Suicidal thoughts 0 - - - -  ?PHQ-9 Score 9 - - - -  ? ? ?History ?There are no problems to display for this patient. ? ?Past Medical History:  ?Diagnosis Date  ? Anxiety   ? ?Past Surgical History:  ?Procedure Laterality Date  ? TONSILECTOMY, ADENOIDECTOMY, BILATERAL MYRINGOTOMY AND TUBES    ? at age 24   ? ?No Known Allergies ?Prior to Admission medications   ?Medication Sig Start Date End Date Taking? Authorizing Provider  ?Multiple Vitamin (MULTIVITAMIN) tablet Take 1 tablet by mouth daily.   Yes [provider]  ?busPIRone (BUSPAR) 10 MG tablet Take 1 tablet (10 mg total) by mouth 2 (two) times daily. ?Patient not taking: Reported on 06/14/2021 04/23/21   Shade Flood, MD  ? ?Social History  ? ?Socioeconomic  History  ? Marital status: Significant Other  ?  Spouse name: Not on file  ? Number of children: Not on file  ? Years of education: Not on file  ? Highest education level: Not on file  ?Occupational History  ? Not on file  ?Tobacco Use  ? Smoking status: Never  ? Smokeless tobacco: Never  ?Substance and Sexual Activity  ? Alcohol use: Never  ?  Alcohol/week: 0.0 standard drinks  ? Drug use: Never  ? Sexual activity: Not on file  ?Other Topics Concern  ? Not on file  ?Social History Narrative  ? Not on file  ? ?Social Determinants of Health  ? ?Financial Resource Strain: Not on file  ?Food Insecurity: Not on file  ?Transportation Needs: Not on file  ?Physical Activity: Not on file   ?Stress: Not on file  ?Social Connections: Not on file  ?Intimate Partner Violence: Not on file  ? ? ?Review of Systems ?Per HPI ? ?Objective:  ? ?Vitals:  ? 06/14/21 0856  ?BP: 126/70  ?Pulse: 70  ?Resp: 15  ?Temp: 97.6 ?F (36.4 ?C)  ?TempSrc: Temporal  ?SpO2: 98%  ?Weight: 199 lb (90.3 kg)  ?Height: 6' (1.829 m)  ? ? ? ?Physical Exam ?Vitals reviewed.  ?Constitutional:   ?   Appearance: He is well-developed.  ?HENT:  ?   Head: Normocephalic and atraumatic.  ?Neck:  ?   Vascular: No carotid bruit or JVD.  ?Cardiovascular:  ?   Rate and Rhythm: Normal rate and regular rhythm.  ?   Heart sounds: Normal heart sounds. No murmur heard. ?Pulmonary:  ?   Effort: Pulmonary effort is normal.  ?   Breath sounds: Normal breath sounds. No rales.  ?Abdominal:  ?   General: Abdomen is flat. There is no distension.  ?   Tenderness: There is no abdominal tenderness.  ?Musculoskeletal:  ?   Right lower leg: No edema.  ?   Left lower leg: No edema.  ?   Comments: Lumbar spine nontender, ambulating without difficulty.  ?Skin: ?   General: Skin is warm and dry.  ?Neurological:  ?   Mental Status: He is alert and oriented to person, place, and time.  ?Psychiatric:     ?   Mood and Affect: Mood normal.  ? ? ? ? ? ?Assessment & Plan:  ?Angel Baxter is a 36 y.o. male . ?Anxiety state - Plan: busPIRone (BUSPAR) 7.5 MG tablet ? -Intolerant to higher dose BuSpar.  Overall managing anxiety well with previous coping techniques, CBT.  Option to restart BuSpar at previously tolerated dose of 7.5 mg twice daily, prescription sent.  RTC precautions if palpitations return. ? ?Need for hepatitis C screening test - Plan: Hepatitis C Antibody ? ?Erectile dysfunction, unspecified erectile dysfunction type - Plan: Testosterone, Ambulatory referral to Urology ? -Acute onset 6 weeks ago without known injury, not cycling, unlikely pudendal injury.  No low back pain.  Less likely neurologic cause.  Denies situational anxiety or relationship stressors  less likely psychologic cause.  Outside labs reviewed as above and testosterone level appear to be in a normal range but will repeat again this morning.  Recommended against testosterone supplementation based on prior lab work.  Refer to urology to discuss symptoms further and decide on further testing versus trial of medication such as sildenafil temporarily. ? ?Meds ordered this encounter  ?Medications  ? busPIRone (BUSPAR) 7.5 MG tablet  ?  Sig: Take 1 tablet (7.5 mg total) by mouth  2 (two) times daily.  ?  Dispense:  60 tablet  ?  Refill:  3  ? ?Patient Instructions  ?For anxiety, ok to restart prior dose of Buspar at 7.5mg  if symptoms return.  ?I will recheck testosterone levels but would not recommend supplementation based on your previous level or if that repeat test is normal.  I will refer you to urology to discuss the sudden change in erectile function. ?If any new back pain, groin pain, incontinence or other new/worsening symptoms be seen right away.  Thanks for coming in today and let me know if there are questions. ? ? ? ? ?Signed,  ? ?Meredith Staggers, MD ?[facility], [facility] ?[facility] ?06/14/21 ?9:48 AM ? ? ?

## 2021-06-14 NOTE — Patient Instructions (Addendum)
For anxiety, ok to restart prior dose of Buspar at 7.5mg  if symptoms return.  ?I will recheck testosterone levels but would not recommend supplementation based on your previous level or if that repeat test is normal.  I will refer you to urology to discuss the sudden change in erectile function. ?If any new back pain, groin pain, incontinence or other new/worsening symptoms be seen right away.  Thanks for coming in today and let me know if there are questions. ? ?

## 2021-06-15 LAB — HEPATITIS C ANTIBODY
Hepatitis C Ab: NONREACTIVE
SIGNAL TO CUT-OFF: <0.02 (ref <1.00)

## 2021-06-15 NOTE — Telephone Encounter (Signed)
Replied on labs.  ?

## 2021-06-15 NOTE — Telephone Encounter (Signed)
Pt asking about the drop in testosterone level ?

## 2021-06-20 ENCOUNTER — Other Ambulatory Visit (INDEPENDENT_AMBULATORY_CARE_PROVIDER_SITE_OTHER): Payer: 59

## 2021-06-20 ENCOUNTER — Ambulatory Visit: Payer: 59 | Admitting: Family Medicine

## 2021-06-20 DIAGNOSIS — R7989 Other specified abnormal findings of blood chemistry: Secondary | ICD-10-CM | POA: Diagnosis not present

## 2021-06-20 DIAGNOSIS — R69 Illness, unspecified: Secondary | ICD-10-CM | POA: Diagnosis not present

## 2021-06-20 LAB — TESTOSTERONE: Testosterone: 437.89 ng/dL (ref 300.00–890.00)

## 2021-06-26 ENCOUNTER — Encounter: Payer: Self-pay | Admitting: Family Medicine

## 2021-07-05 DIAGNOSIS — N529 Male erectile dysfunction, unspecified: Secondary | ICD-10-CM | POA: Diagnosis not present

## 2021-07-05 DIAGNOSIS — Z1329 Encounter for screening for other suspected endocrine disorder: Secondary | ICD-10-CM | POA: Diagnosis not present

## 2021-07-12 DIAGNOSIS — E291 Testicular hypofunction: Secondary | ICD-10-CM | POA: Diagnosis not present

## 2021-07-26 DIAGNOSIS — J029 Acute pharyngitis, unspecified: Secondary | ICD-10-CM | POA: Diagnosis not present

## 2021-07-26 DIAGNOSIS — J020 Streptococcal pharyngitis: Secondary | ICD-10-CM | POA: Diagnosis not present

## 2021-07-27 DIAGNOSIS — R69 Illness, unspecified: Secondary | ICD-10-CM | POA: Diagnosis not present

## 2021-08-01 ENCOUNTER — Telehealth: Payer: 59 | Admitting: Family Medicine

## 2021-08-10 DIAGNOSIS — R69 Illness, unspecified: Secondary | ICD-10-CM | POA: Diagnosis not present

## 2021-08-23 DIAGNOSIS — E291 Testicular hypofunction: Secondary | ICD-10-CM | POA: Diagnosis not present

## 2021-08-23 DIAGNOSIS — R69 Illness, unspecified: Secondary | ICD-10-CM | POA: Diagnosis not present

## 2021-10-01 DIAGNOSIS — R69 Illness, unspecified: Secondary | ICD-10-CM | POA: Diagnosis not present

## 2021-10-03 DIAGNOSIS — R69 Illness, unspecified: Secondary | ICD-10-CM | POA: Diagnosis not present

## 2021-10-05 ENCOUNTER — Ambulatory Visit
Admission: RE | Admit: 2021-10-05 | Discharge: 2021-10-05 | Disposition: A | Discharge status: Home / Self Care | Payer: No Typology Code available for payment source | Source: Ambulatory Visit | Attending: Nurse Practitioner | Admitting: Nurse Practitioner

## 2021-10-05 ENCOUNTER — Other Ambulatory Visit: Payer: Self-pay | Admitting: Nurse Practitioner

## 2021-10-05 DIAGNOSIS — Z021 Encounter for pre-employment examination: Secondary | ICD-10-CM

## 2021-10-05 DIAGNOSIS — R69 Illness, unspecified: Secondary | ICD-10-CM | POA: Diagnosis not present

## 2021-10-14 ENCOUNTER — Other Ambulatory Visit: Payer: Self-pay | Admitting: Family Medicine

## 2021-10-14 DIAGNOSIS — F411 Generalized anxiety disorder: Secondary | ICD-10-CM

## 2021-10-15 DIAGNOSIS — R69 Illness, unspecified: Secondary | ICD-10-CM | POA: Diagnosis not present

## 2021-10-29 DIAGNOSIS — R69 Illness, unspecified: Secondary | ICD-10-CM | POA: Diagnosis not present

## 2021-11-02 DIAGNOSIS — N529 Male erectile dysfunction, unspecified: Secondary | ICD-10-CM | POA: Diagnosis not present

## 2021-11-12 DIAGNOSIS — R69 Illness, unspecified: Secondary | ICD-10-CM | POA: Diagnosis not present

## 2021-11-23 DIAGNOSIS — R69 Illness, unspecified: Secondary | ICD-10-CM | POA: Diagnosis not present

## 2021-11-26 DIAGNOSIS — R69 Illness, unspecified: Secondary | ICD-10-CM | POA: Diagnosis not present

## 2021-11-30 DIAGNOSIS — R69 Illness, unspecified: Secondary | ICD-10-CM | POA: Diagnosis not present

## 2021-12-11 NOTE — Progress Notes (Deleted)
   Subjective:  Patient ID: Angel Baxter, male    DOB: Dec 25, 1985  Age: 36 y.o. MRN: [id number]  CC: No chief complaint on file.   HPI Angel Baxter presents for   Anxiety: Discussed in March. Intolerant to higher dose buspar, option to restart 7.5mg  dosing. Managing with CBT, coping techniques.   Erectile Dysfunction: Followed by urology, initial possible hypogonadism but testosterone normalized on recheck.(279, then 437)  Estrogen level elevated, relative to testosterone, started on anastrozole.  Last visit in July noted with Dr. Lafonda Mosses. Continued anastrozole.   History There are no problems to display for this patient.  Past Medical History:  Diagnosis Date   Anxiety    Past Surgical History:  Procedure Laterality Date   TONSILECTOMY, ADENOIDECTOMY, BILATERAL MYRINGOTOMY AND TUBES     at age 66    No Known Allergies Prior to Admission medications   Medication Sig Start Date End Date Taking? Authorizing Provider  busPIRone (BUSPAR) 7.5 MG tablet TAKE ONE TABLET BY MOUTH TWICE A DAY 10/15/21   Shade Flood, MD  Multiple Vitamin (MULTIVITAMIN) tablet Take 1 tablet by mouth daily.    [provider]   Social History   Socioeconomic History   Marital status: Significant Other    Spouse name: Not on file   Number of children: Not on file   Years of education: Not on file   Highest education level: Not on file  Occupational History   Not on file  Tobacco Use   Smoking status: Never   Smokeless tobacco: Never  Substance and Sexual Activity   Alcohol use: Never    Alcohol/week: 0.0 standard drinks of alcohol   Drug use: Never   Sexual activity: Not on file  Other Topics Concern   Not on file  Social History Narrative   Not on file   Social Determinants of Health   Financial Resource Strain: Not on file  Food Insecurity: Not on file  Transportation Needs: Not on file  Physical Activity: Not on file  Stress: Not on file  Social Connections: Not  on file  Intimate Partner Violence: Not on file    Review of Systems   Objective:  There were no vitals filed for this visit.   Physical Exam     Assessment & Plan:  Angel Baxter is a 36 y.o. male . Anxiety state   No orders of the defined types were placed in this encounter.  There are no Patient Instructions on file for this visit.    Signed,   Meredith Staggers, MD [facility], [facility] 12/11/21 8:24 PM

## 2021-12-12 ENCOUNTER — Ambulatory Visit: Payer: 59 | Admitting: Family Medicine

## 2021-12-12 DIAGNOSIS — E280 Estrogen excess: Secondary | ICD-10-CM

## 2021-12-12 DIAGNOSIS — F411 Generalized anxiety disorder: Secondary | ICD-10-CM

## 2022-02-22 ENCOUNTER — Other Ambulatory Visit: Payer: Self-pay | Admitting: Family Medicine

## 2022-02-22 DIAGNOSIS — F411 Generalized anxiety disorder: Secondary | ICD-10-CM

## 2022-07-06 ENCOUNTER — Other Ambulatory Visit: Payer: Self-pay | Admitting: Family Medicine

## 2022-07-06 DIAGNOSIS — F411 Generalized anxiety disorder: Secondary | ICD-10-CM

## 2022-07-12 IMAGING — DX DG KNEE COMPLETE 4+V*L*
4 series · 4 of 4 positions shown · non-contrast
Comparison: None.

CLINICAL DATA: 34-year-old male with left knee pain. No known
injury.

EXAM:
LEFT KNEE - COMPLETE 4+ VIEW

[knee ap]
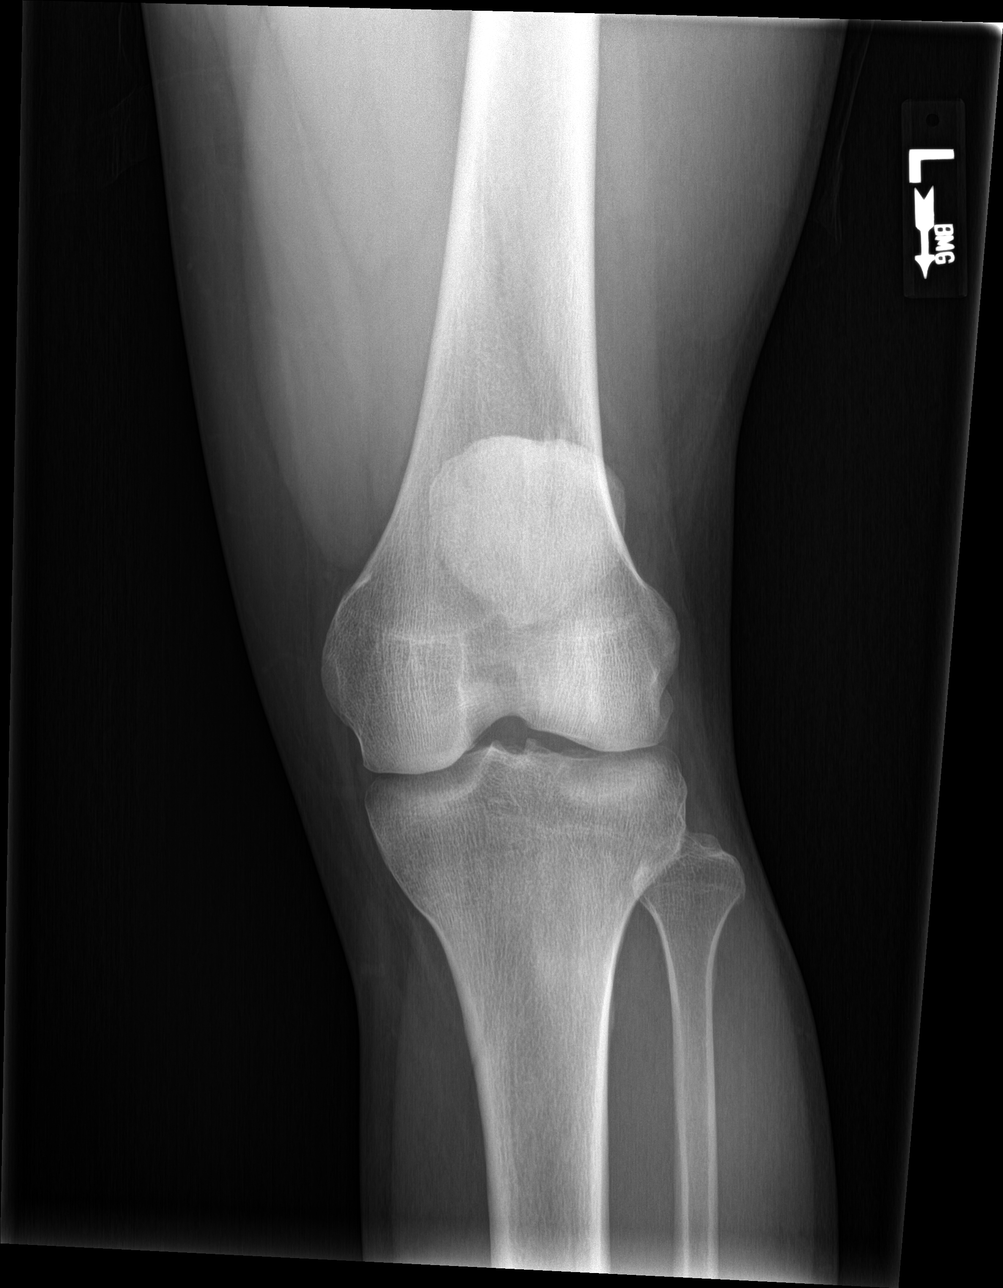

[knee lat]
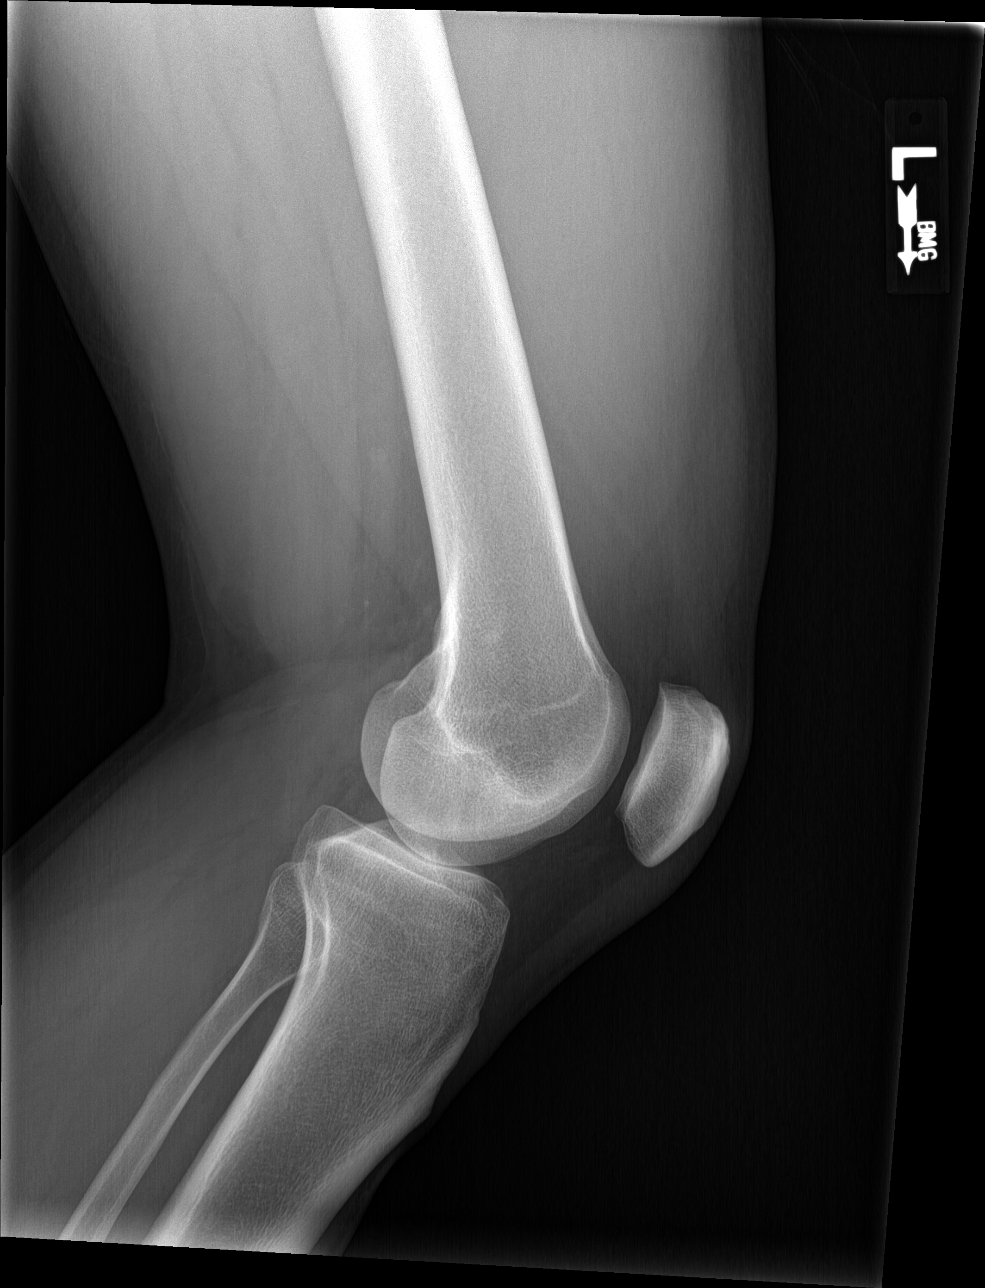

[sunrise]
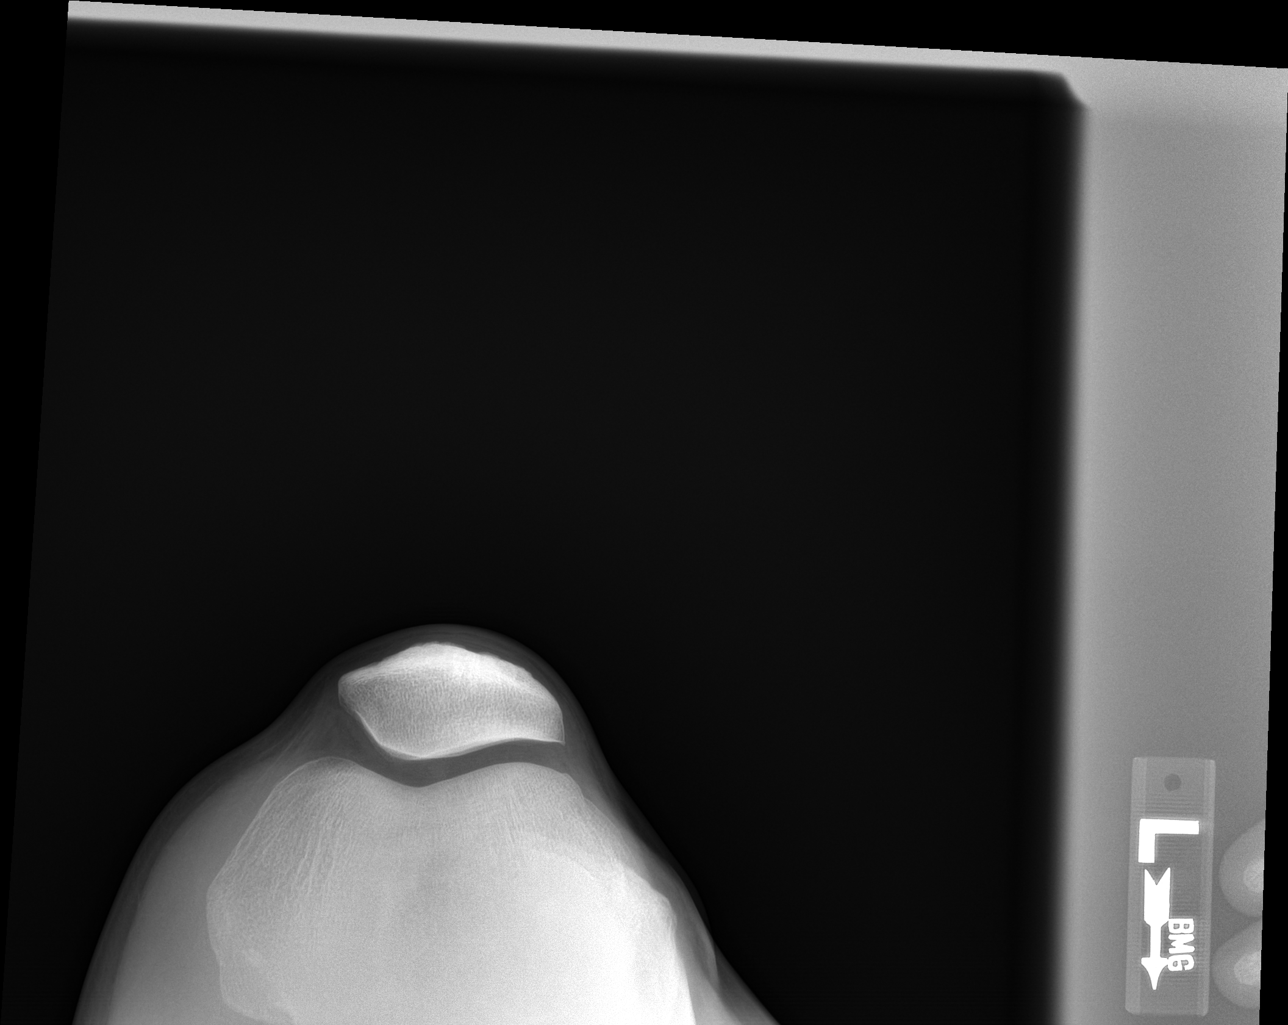

[knee [person_name]]
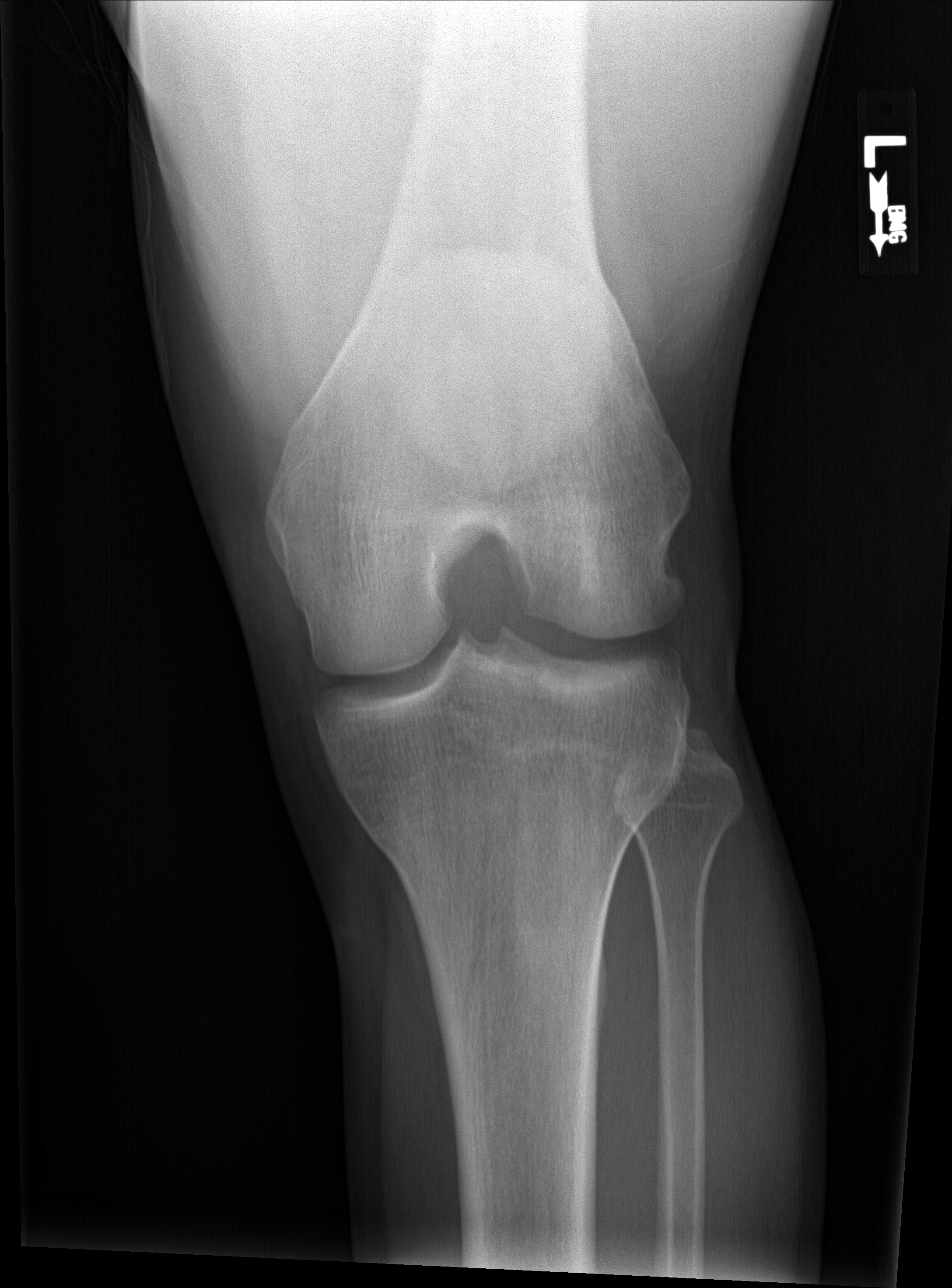

[4 of 4 positions shown; findings below may reference images not displayed]

FINDINGS: No evidence of fracture, dislocation, or joint effusion. No evidence
of arthropathy or other focal bone abnormality. Soft tissues are
unremarkable.
IMPRESSION: Negative.

## 2023-11-04 ENCOUNTER — Other Ambulatory Visit (HOSPITAL_BASED_OUTPATIENT_CLINIC_OR_DEPARTMENT_OTHER): Payer: Self-pay | Admitting: Family Medicine

## 2023-11-04 DIAGNOSIS — Z8249 Family history of ischemic heart disease and other diseases of the circulatory system: Secondary | ICD-10-CM

## 2023-11-14 ENCOUNTER — Other Ambulatory Visit (HOSPITAL_BASED_OUTPATIENT_CLINIC_OR_DEPARTMENT_OTHER): Payer: Self-pay

## 2023-11-24 ENCOUNTER — Ambulatory Visit (HOSPITAL_BASED_OUTPATIENT_CLINIC_OR_DEPARTMENT_OTHER)
Admission: RE | Admit: 2023-11-24 | Discharge: 2023-11-24 | Disposition: A | Discharge status: Home / Self Care | Payer: Self-pay | Source: Ambulatory Visit | Attending: Family Medicine | Admitting: Family Medicine

## 2023-11-24 DIAGNOSIS — Z8249 Family history of ischemic heart disease and other diseases of the circulatory system: Secondary | ICD-10-CM
